# Patient Record
Sex: Male | Born: 1935 | Race: White | Hispanic: No | Marital: Married | Smoking: Former smoker
Health system: Southern US, Community
[De-identification: ages and names within clinical notes are randomized; demographics above are authoritative.]

## PROBLEM LIST (undated history)

## (undated) DIAGNOSIS — E785 Hyperlipidemia, unspecified: Secondary | ICD-10-CM

## (undated) DIAGNOSIS — I10 Essential (primary) hypertension: Secondary | ICD-10-CM

## (undated) DIAGNOSIS — I2510 Atherosclerotic heart disease of native coronary artery without angina pectoris: Secondary | ICD-10-CM

## (undated) DIAGNOSIS — C679 Malignant neoplasm of bladder, unspecified: Secondary | ICD-10-CM

## (undated) DIAGNOSIS — D509 Iron deficiency anemia, unspecified: Secondary | ICD-10-CM

## (undated) DIAGNOSIS — J449 Chronic obstructive pulmonary disease, unspecified: Secondary | ICD-10-CM

## (undated) DIAGNOSIS — D369 Benign neoplasm, unspecified site: Secondary | ICD-10-CM

## (undated) DIAGNOSIS — F32A Depression, unspecified: Secondary | ICD-10-CM

## (undated) DIAGNOSIS — K644 Residual hemorrhoidal skin tags: Secondary | ICD-10-CM

## (undated) DIAGNOSIS — F329 Major depressive disorder, single episode, unspecified: Secondary | ICD-10-CM

## (undated) DIAGNOSIS — C669 Malignant neoplasm of unspecified ureter: Secondary | ICD-10-CM

## (undated) DIAGNOSIS — E119 Type 2 diabetes mellitus without complications: Secondary | ICD-10-CM

## (undated) DIAGNOSIS — K5790 Diverticulosis of intestine, part unspecified, without perforation or abscess without bleeding: Secondary | ICD-10-CM

## (undated) DIAGNOSIS — Z8546 Personal history of malignant neoplasm of prostate: Secondary | ICD-10-CM

## (undated) DIAGNOSIS — H3530 Unspecified macular degeneration: Secondary | ICD-10-CM

## (undated) DIAGNOSIS — G912 (Idiopathic) normal pressure hydrocephalus: Secondary | ICD-10-CM

## (undated) DIAGNOSIS — K648 Other hemorrhoids: Secondary | ICD-10-CM

## (undated) HISTORY — DX: Depression, unspecified: F32.A

## (undated) HISTORY — DX: Malignant neoplasm of unspecified ureter: C66.9

## (undated) HISTORY — DX: (Idiopathic) normal pressure hydrocephalus: G91.2

## (undated) HISTORY — DX: Chronic obstructive pulmonary disease, unspecified: J44.9

## (undated) HISTORY — DX: Benign neoplasm, unspecified site: D36.9

## (undated) HISTORY — PX: TONSILLECTOMY: SUR1361

## (undated) HISTORY — DX: Major depressive disorder, single episode, unspecified: F32.9

## (undated) HISTORY — DX: Diverticulosis of intestine, part unspecified, without perforation or abscess without bleeding: K57.90

## (undated) HISTORY — PX: BLADDER SURGERY: SHX569

## (undated) HISTORY — DX: Atherosclerotic heart disease of native coronary artery without angina pectoris: I25.10

## (undated) HISTORY — DX: Type 2 diabetes mellitus without complications: E11.9

## (undated) HISTORY — DX: Malignant neoplasm of bladder, unspecified: C67.9

## (undated) HISTORY — PX: CATARACT EXTRACTION: SUR2

## (undated) HISTORY — DX: Essential (primary) hypertension: I10

## (undated) HISTORY — DX: Other hemorrhoids: K64.8

## (undated) HISTORY — DX: Hyperlipidemia, unspecified: E78.5

## (undated) HISTORY — DX: Personal history of malignant neoplasm of prostate: Z85.46

## (undated) HISTORY — DX: Residual hemorrhoidal skin tags: K64.4

## (undated) HISTORY — DX: Iron deficiency anemia, unspecified: D50.9

## (undated) HISTORY — PX: APPENDECTOMY: SHX54

## (undated) HISTORY — PX: URETHRA SURGERY: SHX824

## (undated) HISTORY — DX: Unspecified macular degeneration: H35.30

## (undated) HISTORY — PX: NEPHRECTOMY: SHX65

---

## 1998-03-30 ENCOUNTER — Other Ambulatory Visit: Admission: RE | Admit: 1998-03-30 | Discharge: 1998-03-30 | Payer: Self-pay | Admitting: Urology

## 1998-07-20 ENCOUNTER — Other Ambulatory Visit: Admission: RE | Admit: 1998-07-20 | Discharge: 1998-07-20 | Payer: Self-pay | Admitting: Urology

## 1999-12-17 ENCOUNTER — Encounter: Payer: Self-pay | Admitting: *Deleted

## 1999-12-17 ENCOUNTER — Ambulatory Visit (HOSPITAL_COMMUNITY): Admission: RE | Admit: 1999-12-17 | Discharge: 1999-12-17 | Payer: Self-pay | Admitting: *Deleted

## 1999-12-20 ENCOUNTER — Ambulatory Visit (HOSPITAL_COMMUNITY): Admission: RE | Admit: 1999-12-20 | Discharge: 1999-12-20 | Payer: Self-pay | Admitting: Urology

## 2000-01-14 ENCOUNTER — Encounter: Payer: Self-pay | Admitting: Urology

## 2000-01-17 ENCOUNTER — Inpatient Hospital Stay (HOSPITAL_COMMUNITY): Admission: RE | Admit: 2000-01-17 | Discharge: 2000-01-22 | Payer: Self-pay | Admitting: Urology

## 2000-06-02 ENCOUNTER — Encounter: Admission: RE | Admit: 2000-06-02 | Discharge: 2000-08-31 | Payer: Self-pay | Admitting: Internal Medicine

## 2000-07-29 ENCOUNTER — Encounter: Admission: RE | Admit: 2000-07-29 | Discharge: 2000-07-29 | Payer: Self-pay | Admitting: Urology

## 2000-07-29 ENCOUNTER — Encounter: Payer: Self-pay | Admitting: Urology

## 2001-02-23 ENCOUNTER — Encounter: Payer: Self-pay | Admitting: Urology

## 2001-02-23 ENCOUNTER — Encounter: Admission: RE | Admit: 2001-02-23 | Discharge: 2001-02-23 | Payer: Self-pay | Admitting: Urology

## 2001-08-19 ENCOUNTER — Encounter: Payer: Self-pay | Admitting: Urology

## 2001-08-19 ENCOUNTER — Encounter: Admission: RE | Admit: 2001-08-19 | Discharge: 2001-08-19 | Payer: Self-pay | Admitting: Urology

## 2003-08-15 ENCOUNTER — Encounter: Admission: RE | Admit: 2003-08-15 | Discharge: 2003-08-15 | Payer: Self-pay | Admitting: Urology

## 2003-08-18 ENCOUNTER — Ambulatory Visit (HOSPITAL_BASED_OUTPATIENT_CLINIC_OR_DEPARTMENT_OTHER): Admission: RE | Admit: 2003-08-18 | Discharge: 2003-08-18 | Payer: Self-pay | Admitting: Urology

## 2003-08-18 ENCOUNTER — Ambulatory Visit (HOSPITAL_COMMUNITY): Admission: RE | Admit: 2003-08-18 | Discharge: 2003-08-18 | Payer: Self-pay | Admitting: Urology

## 2004-05-03 ENCOUNTER — Encounter (INDEPENDENT_AMBULATORY_CARE_PROVIDER_SITE_OTHER): Payer: Self-pay | Admitting: Specialist

## 2004-05-03 ENCOUNTER — Ambulatory Visit (HOSPITAL_COMMUNITY): Admission: RE | Admit: 2004-05-03 | Discharge: 2004-05-03 | Payer: Self-pay | Admitting: Urology

## 2004-05-03 ENCOUNTER — Ambulatory Visit (HOSPITAL_BASED_OUTPATIENT_CLINIC_OR_DEPARTMENT_OTHER): Admission: RE | Admit: 2004-05-03 | Discharge: 2004-05-03 | Payer: Self-pay | Admitting: Urology

## 2004-09-06 ENCOUNTER — Ambulatory Visit: Payer: Self-pay | Admitting: Endocrinology

## 2005-04-08 ENCOUNTER — Ambulatory Visit: Payer: Self-pay | Admitting: Internal Medicine

## 2005-04-15 ENCOUNTER — Ambulatory Visit: Payer: Self-pay | Admitting: Internal Medicine

## 2005-05-28 ENCOUNTER — Ambulatory Visit: Payer: Self-pay | Admitting: Internal Medicine

## 2005-06-10 ENCOUNTER — Ambulatory Visit: Payer: Self-pay | Admitting: Internal Medicine

## 2005-06-10 ENCOUNTER — Encounter (INDEPENDENT_AMBULATORY_CARE_PROVIDER_SITE_OTHER): Payer: Self-pay | Admitting: Specialist

## 2005-07-09 ENCOUNTER — Ambulatory Visit: Payer: Self-pay | Admitting: Internal Medicine

## 2006-02-06 ENCOUNTER — Ambulatory Visit: Payer: Self-pay | Admitting: Endocrinology

## 2006-02-23 ENCOUNTER — Ambulatory Visit: Payer: Self-pay | Admitting: Internal Medicine

## 2006-06-25 ENCOUNTER — Ambulatory Visit: Payer: Self-pay | Admitting: Internal Medicine

## 2006-07-10 ENCOUNTER — Ambulatory Visit: Payer: Self-pay | Admitting: Internal Medicine

## 2006-07-11 DIAGNOSIS — K5790 Diverticulosis of intestine, part unspecified, without perforation or abscess without bleeding: Secondary | ICD-10-CM

## 2006-07-11 HISTORY — DX: Diverticulosis of intestine, part unspecified, without perforation or abscess without bleeding: K57.90

## 2006-07-21 ENCOUNTER — Ambulatory Visit: Payer: Self-pay | Admitting: Internal Medicine

## 2006-07-21 ENCOUNTER — Encounter (INDEPENDENT_AMBULATORY_CARE_PROVIDER_SITE_OTHER): Payer: Self-pay | Admitting: Specialist

## 2006-07-21 HISTORY — PX: COLONOSCOPY: SHX5424

## 2006-08-21 ENCOUNTER — Ambulatory Visit: Payer: Self-pay | Admitting: Internal Medicine

## 2006-11-30 ENCOUNTER — Ambulatory Visit: Payer: Self-pay | Admitting: Internal Medicine

## 2007-05-10 ENCOUNTER — Encounter: Payer: Self-pay | Admitting: *Deleted

## 2007-05-10 DIAGNOSIS — J449 Chronic obstructive pulmonary disease, unspecified: Secondary | ICD-10-CM | POA: Insufficient documentation

## 2007-05-10 DIAGNOSIS — D509 Iron deficiency anemia, unspecified: Secondary | ICD-10-CM

## 2007-05-10 DIAGNOSIS — I2510 Atherosclerotic heart disease of native coronary artery without angina pectoris: Secondary | ICD-10-CM | POA: Insufficient documentation

## 2007-05-10 DIAGNOSIS — E785 Hyperlipidemia, unspecified: Secondary | ICD-10-CM

## 2007-05-10 DIAGNOSIS — Z87891 Personal history of nicotine dependence: Secondary | ICD-10-CM

## 2007-05-10 DIAGNOSIS — Z8546 Personal history of malignant neoplasm of prostate: Secondary | ICD-10-CM

## 2007-05-10 DIAGNOSIS — J4489 Other specified chronic obstructive pulmonary disease: Secondary | ICD-10-CM | POA: Insufficient documentation

## 2007-05-10 DIAGNOSIS — E119 Type 2 diabetes mellitus without complications: Secondary | ICD-10-CM | POA: Insufficient documentation

## 2007-05-10 DIAGNOSIS — I10 Essential (primary) hypertension: Secondary | ICD-10-CM | POA: Insufficient documentation

## 2007-12-03 ENCOUNTER — Encounter: Payer: Self-pay | Admitting: Internal Medicine

## 2007-12-15 ENCOUNTER — Ambulatory Visit: Payer: Self-pay | Admitting: Internal Medicine

## 2007-12-15 DIAGNOSIS — F329 Major depressive disorder, single episode, unspecified: Secondary | ICD-10-CM | POA: Insufficient documentation

## 2008-06-12 ENCOUNTER — Ambulatory Visit: Payer: Self-pay | Admitting: Internal Medicine

## 2008-06-13 LAB — CONVERTED CEMR LAB
Basophils Absolute: 0 10*3/uL (ref 0.0–0.1)
Bilirubin Urine: NEGATIVE
Bilirubin, Direct: 0.2 mg/dL (ref 0.0–0.3)
Calcium: 8.8 mg/dL (ref 8.4–10.5)
Cholesterol: 262 mg/dL (ref 0–200)
GFR calc Af Amer: 75 mL/min
HCT: 40.7 % (ref 39.0–52.0)
HDL: 33.7 mg/dL — ABNORMAL LOW (ref >39.0)
Hemoglobin: 14.2 g/dL (ref 13.0–17.0)
Ketones, ur: NEGATIVE mg/dL
Leukocytes, UA: NEGATIVE
Lymphocytes Relative: 21.8 % (ref 12.0–46.0)
MCHC: 34.8 g/dL (ref 30.0–36.0)
Monocytes Absolute: 0.6 10*3/uL (ref 0.1–1.0)
Neutro Abs: 4.4 10*3/uL (ref 1.4–7.7)
PSA: 0.61 ng/mL (ref 0.10–4.00)
RDW: 13.4 % (ref 11.5–14.6)
Sodium: 141 meq/L (ref 135–145)
TSH: 2.32 microintl units/mL (ref 0.35–5.50)
Total Bilirubin: 0.7 mg/dL (ref 0.3–1.2)
Total CHOL/HDL Ratio: 7.8
Triglycerides: 291 mg/dL (ref 0–149)
pH: 5.5 (ref 5.0–8.0)

## 2008-06-19 ENCOUNTER — Ambulatory Visit: Payer: Self-pay | Admitting: Internal Medicine

## 2008-12-14 ENCOUNTER — Ambulatory Visit: Payer: Self-pay | Admitting: Internal Medicine

## 2008-12-17 LAB — CONVERTED CEMR LAB
Chloride: 105 meq/L (ref 96–112)
Cholesterol: 256 mg/dL — ABNORMAL HIGH (ref 0–200)
Creatinine, Ser: 1.3 mg/dL (ref 0.4–1.5)
Potassium: 4.4 meq/L (ref 3.5–5.1)
Total CHOL/HDL Ratio: 7
Triglycerides: 240 mg/dL — ABNORMAL HIGH (ref 0.0–149.0)
VLDL: 48 mg/dL — ABNORMAL HIGH (ref 0.0–40.0)

## 2008-12-18 ENCOUNTER — Ambulatory Visit: Payer: Self-pay | Admitting: Internal Medicine

## 2009-06-07 ENCOUNTER — Encounter (INDEPENDENT_AMBULATORY_CARE_PROVIDER_SITE_OTHER): Payer: Self-pay | Admitting: *Deleted

## 2009-06-14 ENCOUNTER — Ambulatory Visit: Payer: Self-pay | Admitting: Internal Medicine

## 2009-06-15 LAB — CONVERTED CEMR LAB
AST: 20 units/L (ref 0–37)
Direct LDL: 159.7 mg/dL
PSA: 0.73 ng/mL (ref 0.10–4.00)
TSH: 2.3 microintl units/mL (ref 0.35–5.50)
Total Bilirubin: 0.8 mg/dL (ref 0.3–1.2)
Total CHOL/HDL Ratio: 7
Triglycerides: 253 mg/dL — ABNORMAL HIGH (ref 0.0–149.0)
VLDL: 50.6 mg/dL — ABNORMAL HIGH (ref 0.0–40.0)

## 2009-06-18 ENCOUNTER — Ambulatory Visit: Payer: Self-pay | Admitting: Internal Medicine

## 2009-06-18 DIAGNOSIS — R5381 Other malaise: Secondary | ICD-10-CM | POA: Insufficient documentation

## 2009-06-18 DIAGNOSIS — R269 Unspecified abnormalities of gait and mobility: Secondary | ICD-10-CM

## 2009-06-18 DIAGNOSIS — R5383 Other fatigue: Secondary | ICD-10-CM

## 2009-12-12 ENCOUNTER — Ambulatory Visit: Payer: Self-pay | Admitting: Internal Medicine

## 2009-12-12 LAB — CONVERTED CEMR LAB
CO2: 29 meq/L (ref 19–32)
Calcium: 9.1 mg/dL (ref 8.4–10.5)
Cholesterol: 293 mg/dL — ABNORMAL HIGH (ref 0–200)
Direct LDL: 165.1 mg/dL
GFR calc non Af Amer: 61.49 mL/min (ref >60)
HDL: 40.1 mg/dL (ref >39.00)
Sodium: 143 meq/L (ref 135–145)
Triglycerides: 304 mg/dL — ABNORMAL HIGH (ref 0.0–149.0)

## 2009-12-24 ENCOUNTER — Ambulatory Visit: Payer: Self-pay | Admitting: Internal Medicine

## 2009-12-24 ENCOUNTER — Telehealth: Payer: Self-pay | Admitting: Internal Medicine

## 2009-12-24 DIAGNOSIS — M79609 Pain in unspecified limb: Secondary | ICD-10-CM

## 2009-12-24 DIAGNOSIS — L02619 Cutaneous abscess of unspecified foot: Secondary | ICD-10-CM

## 2009-12-24 DIAGNOSIS — L03119 Cellulitis of unspecified part of limb: Secondary | ICD-10-CM

## 2009-12-25 ENCOUNTER — Encounter: Payer: Self-pay | Admitting: Internal Medicine

## 2010-01-01 ENCOUNTER — Ambulatory Visit: Payer: Self-pay | Admitting: Internal Medicine

## 2010-01-07 ENCOUNTER — Encounter: Payer: Self-pay | Admitting: Internal Medicine

## 2010-01-11 ENCOUNTER — Ambulatory Visit: Payer: Self-pay | Admitting: Internal Medicine

## 2010-01-14 ENCOUNTER — Encounter: Payer: Self-pay | Admitting: Internal Medicine

## 2010-01-17 ENCOUNTER — Encounter: Payer: Self-pay | Admitting: Internal Medicine

## 2010-01-24 ENCOUNTER — Telehealth: Payer: Self-pay | Admitting: Internal Medicine

## 2010-02-01 ENCOUNTER — Encounter: Payer: Self-pay | Admitting: Internal Medicine

## 2010-02-04 ENCOUNTER — Encounter: Payer: Self-pay | Admitting: Internal Medicine

## 2010-02-08 HISTORY — PX: VENTRICULOPERITONEAL SHUNT: SHX204

## 2010-02-12 ENCOUNTER — Encounter: Payer: Self-pay | Admitting: Internal Medicine

## 2010-02-14 ENCOUNTER — Encounter: Payer: Self-pay | Admitting: Internal Medicine

## 2010-02-16 ENCOUNTER — Inpatient Hospital Stay (HOSPITAL_COMMUNITY): Admission: EM | Admit: 2010-02-16 | Discharge: 2010-02-25 | Payer: Self-pay | Admitting: Emergency Medicine

## 2010-03-06 ENCOUNTER — Inpatient Hospital Stay (HOSPITAL_COMMUNITY): Admission: RE | Admit: 2010-03-06 | Discharge: 2010-03-07 | Payer: Self-pay | Admitting: Neurosurgery

## 2010-03-10 ENCOUNTER — Emergency Department (HOSPITAL_COMMUNITY): Admission: EM | Admit: 2010-03-10 | Discharge: 2010-03-10 | Payer: Self-pay | Admitting: Emergency Medicine

## 2010-03-20 ENCOUNTER — Encounter: Payer: Self-pay | Admitting: Internal Medicine

## 2010-04-12 ENCOUNTER — Ambulatory Visit: Payer: Self-pay | Admitting: Internal Medicine

## 2010-04-12 DIAGNOSIS — G910 Communicating hydrocephalus: Secondary | ICD-10-CM | POA: Insufficient documentation

## 2010-04-18 ENCOUNTER — Ambulatory Visit (HOSPITAL_COMMUNITY): Admission: RE | Admit: 2010-04-18 | Discharge: 2010-04-18 | Payer: Self-pay | Admitting: Neurosurgery

## 2010-04-18 ENCOUNTER — Encounter: Payer: Self-pay | Admitting: Internal Medicine

## 2010-05-02 ENCOUNTER — Ambulatory Visit (HOSPITAL_COMMUNITY): Admission: RE | Admit: 2010-05-02 | Discharge: 2010-05-02 | Payer: Self-pay | Admitting: Neurosurgery

## 2010-05-02 ENCOUNTER — Encounter: Payer: Self-pay | Admitting: Internal Medicine

## 2010-05-09 ENCOUNTER — Telehealth: Payer: Self-pay | Admitting: Internal Medicine

## 2010-06-03 ENCOUNTER — Ambulatory Visit (HOSPITAL_COMMUNITY): Admission: RE | Admit: 2010-06-03 | Discharge: 2010-06-03 | Payer: Self-pay | Admitting: Neurosurgery

## 2010-06-04 ENCOUNTER — Encounter: Payer: Self-pay | Admitting: Internal Medicine

## 2010-07-05 ENCOUNTER — Ambulatory Visit: Payer: Self-pay | Admitting: Internal Medicine

## 2010-07-12 ENCOUNTER — Ambulatory Visit: Payer: Self-pay | Admitting: Internal Medicine

## 2010-07-16 LAB — CONVERTED CEMR LAB
ALT: 15 units/L (ref 0–53)
AST: 18 units/L (ref 0–37)
Albumin: 3.7 g/dL (ref 3.5–5.2)
BUN: 22 mg/dL (ref 6–23)
Basophils Relative: 0.3 % (ref 0.0–3.0)
CO2: 28 meq/L (ref 19–32)
Chloride: 102 meq/L (ref 96–112)
Creatinine, Ser: 1.3 mg/dL (ref 0.4–1.5)
Eosinophils Absolute: 0.1 10*3/uL (ref 0.0–0.7)
Eosinophils Relative: 1.6 % (ref 0.0–5.0)
Glucose, Bld: 145 mg/dL — ABNORMAL HIGH (ref 70–99)
Lymphocytes Relative: 17.7 % (ref 12.0–46.0)
Monocytes Absolute: 0.5 10*3/uL (ref 0.1–1.0)
Neutrophils Relative %: 73.4 % (ref 43.0–77.0)
Platelets: 189 10*3/uL (ref 150.0–400.0)
Potassium: 4 meq/L (ref 3.5–5.1)
RBC: 4.11 M/uL — ABNORMAL LOW (ref 4.22–5.81)
TSH: 1.35 microintl units/mL (ref 0.35–5.50)
Total Protein: 6.3 g/dL (ref 6.0–8.3)
WBC: 6.5 10*3/uL (ref 4.5–10.5)

## 2010-09-02 ENCOUNTER — Ambulatory Visit (HOSPITAL_COMMUNITY)
Admission: RE | Admit: 2010-09-02 | Discharge: 2010-09-02 | Payer: Self-pay | Source: Home / Self Care | Attending: Neurosurgery | Admitting: Neurosurgery

## 2010-09-02 ENCOUNTER — Encounter: Payer: Self-pay | Admitting: Internal Medicine

## 2010-09-10 NOTE — Miscellaneous (Signed)
Summary: Order / [facility]  Order / [facility]   Imported By: Lennie Odor 01/14/2010 16:54:59  _____________________________________________________________________  External Attachment:    Type:   Image     Comment:   External Document

## 2010-09-10 NOTE — Miscellaneous (Signed)
Summary: Order/[facility]  Order/[facility]   Imported By: Sherian Rein 02/14/2010 09:54:04  _____________________________________________________________________  External Attachment:    Type:   Image     Comment:   External Document

## 2010-09-10 NOTE — Letter (Signed)
Summary: [facility]  [facility]   Imported By: Sherian Rein 06/27/2010 10:55:08  _____________________________________________________________________  External Attachment:    Type:   Image     Comment:   External Document

## 2010-09-10 NOTE — Assessment & Plan Note (Signed)
Summary: YEARLY FU/ MEDICARE AND BCBS/NWS #   Vital Signs:  Patient profile:   77 year old male Height:      69 inches Weight:      175 pounds BMI:     25.94 O2 Sat:      95 % on Room air Temp:     98.9 degrees F oral Pulse rate:   84 / minute BP sitting:   170 / 88  (left arm) Cuff size:   regular  Vitals Entered By: Lucious Groves (Dec 24, 2009 10:34 AM)  O2 Flow:  Room air CC: Yearly f/u--Pt has had a fall recently and c/o not being able to breathe./kb Is Patient Diabetic? No Pain Assessment Patient in pain? no      Comments Patient spouse notes that he is not taking Spiriva./kb   CC:  Yearly f/u--Pt has had a fall recently and c/o not being able to breathe./kb.  History of Present Illness: Larey Seat on mother's day - lost balance. He bent over to pick up smthg.  He did not feel anything in 2 h - then L ankle started to hurt. It started to swell  The patient presents for a wellness examination  Diet: Heart healthy   Physical activity - not active.   Depression/mood screen: Negative.  Hearing: Intact  bilateral.  Visual Acuity: Grossly abnormal w/glasses.  ADL's: not capable. Fall risk: High.  Home safety: good otherwise.  End of LifePlanning/Advanced directive - Full code. I agree. Discussed the need for AD.   Current Medications (verified): 1)  Cartia Xt 180 Mg  Cp24 (Diltiazem Hcl Coated Beads) .... Take 1 By Mouth Qd 2)  Zoloft 100 Mg  Tabs (Sertraline Hcl) .... Take 2 By Mouth Qd 3)  Fish Oil 1200 Mg  Caps (Omega-3 Fatty Acids) .... Two Times A Day 4)  Vitamin D 1000 Unit  Tabs (Cholecalciferol) .... Once Daily 5)  Baby Aspirin 81 Mg Chew (Aspirin) .... Once Daily  Allergies (verified): 1)  ! Sulfa  Past History:  Past Medical History: Last updated: 12/15/2007 Anemia-iron deficiency Prostate cancer, hx of COPD Coronary artery disease  Diabetes mellitus, type II Hyperlipidemia Hypertension Macular degeneration B 2008 Depression  Past Surgical  History: Last updated: 05/10/2007 Nephrectomy  Family History: Last updated: 12/15/2007 CAD  Social History: Last updated: 12/15/2007 Retired Married Current Smoker  Review of Systems       The patient complains of anorexia, vision loss, dyspnea on exertion, peripheral edema, muscle weakness, difficulty walking, and depression.  The patient denies fever, weight loss, weight gain, decreased hearing, hoarseness, chest pain, syncope, prolonged cough, headaches, hemoptysis, abdominal pain, melena, hematochezia, severe indigestion/heartburn, hematuria, incontinence, genital sores, suspicious skin lesions, transient blindness, unusual weight change, abnormal bleeding, enlarged lymph nodes, angioedema, and testicular masses.    Physical Exam  General:  NAD, overweight-appearing.  Chronically ill-appearing in his PJ w/wife at his side Head:  Normocephalic and atraumatic without obvious abnormalities. No apparent alopecia or balding. Eyes:  No corneal or conjunctival inflammation noted. EOMI. Perrla. Ears:  B hearing aids Nose:  WNL Mouth:  Not dry Neck:  No mass Chest Wall:  NT Lungs:  R decreased breath sounds and L decreased breath sounds.   Heart:  No gallop RRR Abdomen:  Bowel sounds positive,abdomen soft and non-tender without masses, organomegaly or hernias noted. Rectal:  per Urol Prostate:  per Urol Msk:  L foot is erythem/purple and hot  in a sock like distrib Toes 1-3 are bruised  There  are scabs on toes Extremities:  1+ left pedal edema.   Neurologic:  alert & oriented X3.   Skin:  as above Cervical Nodes:  No lymphadenopathy noted Inguinal Nodes:  No significant adenopathy Psych:  normally interactive and good eye contact.  not suicidal and depressed affect.  Oriented X3, not suicidal, depressed affect, subdued, and judgment fair.     Impression & Recommendations:  Problem # 1:  PHYSICAL EXAMINATION (ICD-V70.0) Assessment New Overall doing well, age appropriate  education and counseling updated and referral for appropriate preventive services done unless declined, immunizations up to date or declined, diet counseling done if overweight, urged to quit smoking if smokes, most recent labs reviewed and current ordered if appropriate, ecg reviewed or declined (interpretation per ECG scanned in the EMR if done); information regarding Medicare Preventation requirements given if appropriate.  The labs were reviewed with the patient.   Problem # 2:  CELLULITIS, FOOT (ICD-682.7) L - possible Assessment: New  His updated medication list for this problem includes:    Augmentin 875-125 Mg Tabs (Amoxicillin-pot clavulanate) .Marland Kitchen... 1 by mouth bid  Problem # 3:  FOOT PAIN (ICD-729.5) L - fibula fx on Xray Assessment: New Ortho tomorrow Orders: T-Ankle Comp Left Min 3 Views (73610TC) T-Foot Left Min 3 Views (73630TC) Home Health Referral (Home Health) Ace Wraps 3-5 in/yard  (Z6109)  Problem # 4:  FATIGUE (ICD-780.79) chronic/ FTT Assessment: Unchanged  Problem # 5:  CELLULITIS, FOOT (ICD-682.7) possible Assessment: New  His updated medication list for this problem includes:    Augmentin 875-125 Mg Tabs (Amoxicillin-pot clavulanate) .Marland Kitchen... 1 by mouth two times a day - empiric  Orders: Prescription Created Electronically [phone])  Problem # 6:  CORONARY ARTERY DISEASE (ICD-414.00) Assessment: Unchanged  His updated medication list for this problem includes:    Cartia Xt 180 Mg Cp24 (Diltiazem hcl coated beads) .Marland Kitchen... Take 1 by mouth qd    Baby Aspirin 81 Mg Chew (Aspirin) ..... Once daily  Orders: Home Health Referral (Home Health)  Complete Medication List: 1)  Cartia Xt 180 Mg Cp24 (Diltiazem hcl coated beads) .... Take 1 by mouth qd 2)  Zoloft 100 Mg Tabs (Sertraline hcl) .... Take 2 by mouth qd 3)  Fish Oil 1200 Mg Caps (Omega-3 fatty acids) .... Two times a day 4)  Vitamin D 1000 Unit Tabs (Cholecalciferol) .... Once daily 5)  Baby Aspirin 81 Mg  Chew (Aspirin) .... Once daily 6)  Augmentin 875-125 Mg Tabs (Amoxicillin-pot clavulanate) .Marland Kitchen.. 1 by mouth bid  Patient Instructions: 1)  Elevate left foot 20 min every 2-3 hrs 2)  Please schedule a follow-up appointment in 1-2 weeks. 3)  Call if you are not better in a reasonable amount of time or if worse. Go to ER if feeling really bad!  Prescriptions: AUGMENTIN 875-125 MG TABS (AMOXICILLIN-POT CLAVULANATE) 1 by mouth bid  #20 x 0   Entered and Authorized by:   Tresa Garter MD   Signed by:   Tresa Garter MD on 12/24/2009   Method used:   Electronically to        [facility] [address] [phone]* (retail)       [address]       [place], [place]  [address]       Ph: [phone] or [phone]       Fax: [phone]   RxID:   [id number]

## 2010-09-10 NOTE — Assessment & Plan Note (Signed)
Summary: 3 MTH FU  STC   Vital Signs:  Patient profile:   77 year old male Height:      69 inches Weight:      167 pounds BMI:     24.75 Temp:     97.5 degrees F oral Pulse rate:   72 / minute Pulse rhythm:   regular Resp:     16 per minute BP sitting:   130 / 64  (left arm) Cuff size:   regular  Vitals Entered By: Lanier Prude, CMA(AAMA) (July 12, 2010 1:55 PM) CC: 3 mo f/u    CC:  3 mo f/u .  History of Present Illness: The patient presents for a follow up of hypertension, depression, hyperlipidemia   Current Medications (verified): 1)  Cartia Xt 180 Mg  Cp24 (Diltiazem Hcl Coated Beads) .... Take 1 By Mouth Qd 2)  Zoloft 100 Mg  Tabs (Sertraline Hcl) .... Take 2 By Mouth Qd 3)  Fish Oil 1200 Mg  Caps (Omega-3 Fatty Acids) .... Two Times A Day 4)  Vitamin D 2000 Unit Tabs (Cholecalciferol) .Marland Kitchen.. 1 By Mouth Two Times A Day 5)  Baby Aspirin 81 Mg Chew (Aspirin) .... Once Daily 6)  A Walker .... As Dirr.  Allergies (verified): 1)  ! Sulfa 2)  Remeron  Past History:  Past Medical History: Last updated: 04/12/2010 Anemia-iron deficiency Prostate cancer, hx of COPD Coronary artery disease Normal pressure hydrocephallus 2011 Diabetes mellitus, type II Hyperlipidemia Hypertension Macular degeneration B 2008 Depression  Past Surgical History: Last updated: 04/12/2010 Nephrectomy Cerebral shunt 7/11  Social History: Last updated: 04/12/2010 Retired Married Former Smoker 7/11  Review of Systems  The patient denies fever, decreased hearing, dyspnea on exertion, abdominal pain, and melena.    Physical Exam  General:  NAD, overweight-appearing.  Not chronically ill-appearing  Ears:  B hearing aids Mouth:  Not dry Neck:  No mass Lungs:  R decreased breath sounds and L decreased breath sounds.   Heart:  No gallop RRR Abdomen:  Bowel sounds positive,abdomen soft and non-tender without masses, organomegaly or hernias noted. Msk:   better  balance Using a cane Neurologic:  alert & oriented ataxic gait.   Skin:  as above Psych:  normally interactive and good eye contact.  not suicidal and depressed affect.  Oriented subdued.     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 Take 2 by mouth once daily OK to start to taper it down  Problem # 4:  FATIGUE (ICD-780.79) Assessment: Unchanged  Problem # 5:  DIABETES MELLITUS, TYPE II (ICD-250.00) Assessment: Unchanged  His updated medication list for this problem includes:    Baby Aspirin 81 Mg Chew (Aspirin) ..... Once daily  Orders: TLB-A1C / Hgb A1C (Glycohemoglobin) (83036-A1C) TLB-BMP (Basic Metabolic Panel-BMET) (80048-METABOL) TLB-Hepatic/Liver Function Pnl (80076-HEPATIC) TLB-CBC Platelet - w/Differential (85025-CBCD) TLB-TSH (Thyroid Stimulating Hormone) (84443-TSH)  Complete Medication List: 1)  Cartia Xt 180 Mg Cp24 (Diltiazem hcl coated beads) .... Take 1 by mouth qd 2)  Zoloft 100 Mg Tabs (Sertraline hcl) .... Take 2 by mouth  qd 3)  Fish Oil 1200 Mg Caps (Omega-3 fatty acids) .... Two times a day 4)  Vitamin D 2000 Unit Tabs (Cholecalciferol) .Marland Kitchen.. 1 by mouth two times a day 5)  Baby Aspirin 81 Mg Chew (Aspirin) .... Once daily 6)  A Walker  .... As dirr.  Patient Instructions: 1)  Zoloft: 150 mg a day x 1 month, then 100 mg daily x 1 month, then 50 mg daily x 1 month 2)  Please schedule a follow-up  appointment in 3 months. 3)  BMP prior to visit, ICD-9: 4)  HbgA1C prior to visit, ICD-9:250.00   Orders Added: 1)  TLB-A1C / Hgb A1C (Glycohemoglobin) [83036-A1C] 2)  TLB-BMP (Basic Metabolic Panel-BMET) [80048-METABOL] 3)  TLB-Hepatic/Liver Function Pnl [80076-HEPATIC] 4)  TLB-CBC Platelet - w/Differential [85025-CBCD] 5)  TLB-TSH (Thyroid Stimulating Hormone) [84443-TSH] 6)  Est. Patient Level IV [57846]   Immunization History:  Pneumovax Immunization History:    Pneumovax:  historical (02/17/2007)   Immunization History:  Pneumovax Immunization History:    Pneumovax:  Historical (02/17/2007)

## 2010-09-10 NOTE — Miscellaneous (Signed)
Summary: Certification & Plan of Care / [facility]  Certification & Plan of Care / [facility]   Imported By: Lennie Odor 01/14/2010 16:56:48  _____________________________________________________________________  External Attachment:    Type:   Image     Comment:   External Document

## 2010-09-10 NOTE — Miscellaneous (Signed)
Summary: Certificate Addendum / [facility] Face / [facility]  Certificate Addendum / [facility] / [facility]   Imported By: Lennie Odor 01/14/2010 17:05:53  _____________________________________________________________________  External Attachment:    Type:   Image     Comment:   External Document

## 2010-09-10 NOTE — Miscellaneous (Signed)
Summary: Order/[facility]  [facility]   Imported By: Sherian Rein 02/19/2010 08:06:54  _____________________________________________________________________  External Attachment:    Type:   Image     Comment:   External Document

## 2010-09-10 NOTE — Letter (Signed)
Summary: [facility]  [facility]   Imported By: Sherian Rein 04/02/2010 09:00:47  _____________________________________________________________________  External Attachment:    Type:   Image     Comment:   External Document

## 2010-09-10 NOTE — Letter (Signed)
Summary: [facility]  [facility]   Imported By: Sherian Rein 02/13/2010 12:14:32  _____________________________________________________________________  External Attachment:    Type:   Image     Comment:   External Document

## 2010-09-10 NOTE — Letter (Signed)
Summary: [facility]  [facility]   Imported By: Sherian Rein 05/21/2010 13:52:45  _____________________________________________________________________  External Attachment:    Type:   Image     Comment:   External Document

## 2010-09-10 NOTE — Miscellaneous (Signed)
Summary: [name]   Imported By: Sherian Rein 01/21/2010 08:04:32  _____________________________________________________________________  External Attachment:    Type:   Image     Comment:   External Document

## 2010-09-10 NOTE — Assessment & Plan Note (Signed)
Summary: discharge from [facility]/#/cd   Vital Signs:  Patient profile:   77 year old male Height:      69 inches Weight:      164 pounds O2 Sat:      91 % on Room air Temp:     97.5 degrees F oral Pulse rate:   85 / minute Pulse rhythm:   regular Resp:     16 per minute BP sitting:   128 / 80  (left arm) Cuff size:   regular  Vitals Entered By: Lanier Prude, CMA(AAMA) (April 12, 2010 9:43 AM)  O2 Flow:  Room air CC: f/u  Is Patient Diabetic? Yes Comments pt is not taking ASA. He is taking Tylenol 500mg  1 as needed instead   CC:  f/u .  History of Present Illness: The patient presents for a follow up of  leg pain, falls.  back pain, anxiety, depression and headaches.   Preventive Screening-Counseling & Management  Alcohol-Tobacco     Smoking Status: quit  Current Medications (verified): 1)  Cartia Xt 180 Mg  Cp24 (Diltiazem Hcl Coated Beads) .... Take 1 By Mouth Qd 2)  Zoloft 100 Mg  Tabs (Sertraline Hcl) .... Take 2 By Mouth Qd 3)  Fish Oil 1200 Mg  Caps (Omega-3 Fatty Acids) .... Two Times A Day 4)  Vitamin D 1000 Unit  Tabs (Cholecalciferol) .... Once Daily 5)  Baby Aspirin 81 Mg Chew (Aspirin) .... Once Daily  Allergies (verified): 1)  ! Sulfa 2)  Remeron  Past History:  Social History: Last updated: 04/12/2010 Retired Married Former Smoker 7/11  Past Medical History: Anemia-iron deficiency Prostate cancer, hx of COPD Coronary artery disease Normal pressure hydrocephallus 2011 Diabetes mellitus, type II Hyperlipidemia Hypertension Macular degeneration B 2008 Depression  Past Surgical History: Nephrectomy Cerebral shunt 7/11  Social History: Retired Married Former Smoker 7/11 Smoking Status:  quit  Review of Systems       The patient complains of difficulty walking.  The patient denies fever, chest pain, hemoptysis, and abdominal pain.         using walker  Physical Exam  General:  NAD, overweight-appearing.   Chronically ill-appearing w/wife at his side Head:  palpable shunt Eyes:  No corneal or conjunctival inflammation noted. EOMI. Perrla. Mouth:  Not dry Neck:  No mass Lungs:  R decreased breath sounds and L decreased breath sounds.   Heart:  No gallop RRR Abdomen:  Bowel sounds positive,abdomen soft and non-tender without masses, organomegaly or hernias noted. Msk:   better balance Using a walker Neurologic:  alert & oriented ataxic gait.   Skin:  as above Psych:  normally interactive and good eye contact.  not suicidal and depressed affect.  Oriented subdued.     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 Once daily  Problem # 5:  HYPERTENSION (ICD-401.9) Assessment: Unchanged  His updated medication list for this problem includes:    Cartia Xt 180 Mg Cp24 (Diltiazem hcl coated beads) .Marland Kitchen... Take 1 by mouth qd  BP today: 128/80 Prior BP: 154/80 (01/01/2010)  Labs Reviewed: K+: 4.4 (12/12/2009) Creat: : 1.2 (12/12/2009)  Chol: 293 (12/12/2009)   HDL: 40.10 (12/12/2009)   LDL: DEL (06/12/2008)   TG: 304.0 (12/12/2009)  Problem # 6:  TOBACCO USE, QUIT (ICD-V15.82) Assessment: Improved quit  Complete Medication List: 1)  Cartia Xt 180 Mg Cp24 (Diltiazem hcl coated beads) .... Take 1 by mouth qd 2)  Zoloft 100 Mg Tabs (Sertraline hcl) .... Take 2 by mouth qd 3)  Fish Oil 1200 Mg Caps (Omega-3 fatty acids) .... Two times a day 4)  Vitamin D 1000 Unit Tabs (Cholecalciferol) .... Once daily 5)  Baby Aspirin 81 Mg Chew (Aspirin) .... Once daily 6)  A Walker  .... As dirr.  Other Orders: Flu  Vaccine 5yrs + (32951) Administration Flu vaccine - MCR (O8416)  Patient Instructions: 1)  Please schedule a follow-up appointment in 3 months. 2)  BMP prior to visit, ICD-9: 3)  Hepatic Panel prior to visit, ICD-9: 4)  TSH prior to visit, ICD-9: 5)  CBC w/ Diff prior to visit, ICD-9: 6)  Urine Microalbumin prior to visit, ICD-9: 995.20 250.00 401.1 Prescriptions: A WALKER as dirr.  #1 x 0   Entered and Authorized by:   Tresa Garter MD   Signed by:   Tresa Garter MD on 04/12/2010   Method used:   Print then Give to Patient   RxID:   [id number]     Orders Added: 1)  Flu Vaccine 39yrs + [73220] 2)  Administration Flu vaccine - MCR [G0008] 3)  Est. Patient Level IV [25427]  Flu Vaccine Consent Questions     Do you have a history of severe allergic reactions to this vaccine? no    Any prior history of allergic reactions to egg and/or gelatin? no    Do you have a sensitivity to the preservative Thimersol? no    Do you have a past history of Guillan-Barre Syndrome? no    Do you currently have an acute febrile illness? no    Have you ever had a severe reaction to latex? no    Vaccine information given and explained to patient? yes    Are you currently pregnant? no    Lot Number:[id number]   Exp Date:02/08/2011   Site Given  Left Deltoid IM Lanier Prude, [facility])  April 12, 2010 5:03 PM

## 2010-09-10 NOTE — Letter (Signed)
Summary: [facility]  [facility]   Imported By: Sherian Rein 02/26/2010 10:24:48  _____________________________________________________________________  External Attachment:    Type:   Image     Comment:   External Document

## 2010-09-10 NOTE — Miscellaneous (Signed)
Summary: Order/[facility]  [facility]   Imported By: Lester Powhatan 01/11/2010 10:37:40  _____________________________________________________________________  External Attachment:    Type:   Image     Comment:   External Document

## 2010-09-10 NOTE — Letter (Signed)
Summary: [facility]  [facility]   Imported By: Sherian Rein 01/16/2010 07:52:00  _____________________________________________________________________  External Attachment:    Type:   Image     Comment:   External Document

## 2010-09-10 NOTE — Progress Notes (Signed)
Summary: Schedule Office visit   Phone Note Outgoing Call Call back at [facility] Phone [phone]   Call placed by: Harlow Mares CMA Duncan Dull),  January 24, 2010 4:13 PM Call placed to: Patient Summary of Call: patient has a broken foot and the wife will call back to schedule an office visit ot discuss colonoscopy Initial call taken by: Harlow Mares CMA Duncan Dull),  January 24, 2010 4:30 PM

## 2010-09-10 NOTE — Miscellaneous (Signed)
Summary: PT Order/[facility]  PT Order/[facility]   Imported By: Sherian Rein 02/06/2010 07:52:06  _____________________________________________________________________  External Attachment:    Type:   Image     Comment:   External Document

## 2010-09-10 NOTE — Assessment & Plan Note (Signed)
Summary: 1-2 WK F/U // # / CD   Vital Signs:  Patient profile:   77 year old male Height:      69 inches Weight:      176 pounds BMI:     26.08 O2 Sat:      93 % on Room air Temp:     97 degrees F oral Pulse rate:   85 / minute BP sitting:   154 / 80  (left arm) Cuff size:   regular  Vitals Entered ByZella Ball Ewing (Jan 01, 2010 10:23 AM)  O2 Flow:  Room air CC: 2 week followup/RE   CC:  2 week followup/RE.  History of Present Illness: The patient presents for a follow up of FTT, fibula fx,  hypertension.   Current Medications (verified): 1)  Cartia Xt 180 Mg  Cp24 (Diltiazem Hcl Coated Beads) .... Take 1 By Mouth Qd 2)  Zoloft 100 Mg  Tabs (Sertraline Hcl) .... Take 2 By Mouth Qd 3)  Fish Oil 1200 Mg  Caps (Omega-3 Fatty Acids) .... Two Times A Day 4)  Vitamin D 1000 Unit  Tabs (Cholecalciferol) .... Once Daily 5)  Baby Aspirin 81 Mg Chew (Aspirin) .... Once Daily 6)  Augmentin 875-125 Mg Tabs (Amoxicillin-Pot Clavulanate) .Marland Kitchen.. 1 By Mouth Bid  Allergies (verified): 1)  ! Sulfa  Physical Exam  General:  NAD, overweight-appearing.  Chronically ill-appearing in his PJ w/wife at his side Nose:  WNL Mouth:  Not dry Neck:  No mass Lungs:  R decreased breath sounds and L decreased breath sounds.   Heart:  No gallop RRR Abdomen:  Bowel sounds positive,abdomen soft and non-tender without masses, organomegaly or hernias noted. Msk:  all better Extremities:  No  pedal edema.   Neurologic:  alert & oriented X3.   Skin:  as above Psych:  normally interactive and good eye contact.  not suicidal and depressed affect.  Oriented X3, not suicidal, depressed affect, subdued, and judgment fair.     Impression & Recommendations:  Problem # 1:  FOOT PAIN (ICD-729.5) due to fx L Assessment Improved L leg is in a boot. Getting PT  Problem # 2:  CELLULITIS, FOOT (ICD-682.7) Assessment: Improved  His updated medication list for this problem includes:    Augmentin 875-125 Mg  Tabs (Amoxicillin-pot clavulanate) .Marland Kitchen... 1 by mouth two times a day ok to d/c  Problem # 3:  DIABETES MELLITUS, TYPE II (ICD-250.00) Assessment: Unchanged  His updated medication list for this problem includes:    Baby Aspirin 81 Mg Chew (Aspirin) ..... Once daily  Problem # 4:  HYPERTENSION (ICD-401.9) Assessment: Unchanged  His updated medication list for this problem includes:    Cartia Xt 180 Mg Cp24 (Diltiazem hcl coated beads) .Marland Kitchen... Take 1 by mouth qd  BP today: 154/80 Prior BP: 170/88 (12/24/2009)  Labs Reviewed: K+: 4.4 (12/12/2009) Creat: : 1.2 (12/12/2009)   Chol: 293 (12/12/2009)   HDL: 40.10 (12/12/2009)   LDL: DEL (06/12/2008)   TG: 304.0 (12/12/2009)  Problem # 5:  FATIGUE (ICD-780.79) multifactorial Assessment: Improved  Problem # 6:  COPD (ICD-496) Assessment: Unchanged  Complete Medication List: 1)  Cartia Xt 180 Mg Cp24 (Diltiazem hcl coated beads) .... Take 1 by mouth qd 2)  Zoloft 100 Mg Tabs (Sertraline hcl) .... Take 2 by mouth qd 3)  Fish Oil 1200 Mg Caps (Omega-3 fatty acids) .... Two times a day 4)  Vitamin D 1000 Unit Tabs (Cholecalciferol) .... Once daily 5)  Baby Aspirin 81  Mg Chew (Aspirin) .... Once daily 6)  Augmentin 875-125 Mg Tabs (Amoxicillin-pot clavulanate) .Marland Kitchen.. 1 by mouth bid  Patient Instructions: 1)  Please schedule a follow-up appointment in 2 months. 2)  Stop Augmentin

## 2010-09-10 NOTE — Progress Notes (Signed)
Summary: FYI  Phone Note Other Incoming   Caller: Alcario Drought @ [facility] Summary of Call: FYI: pt has been DC'd from nursing at [facility].  Any questions call [phone] Initial call taken by: Lanier Prude, [facility]),  May 09, 2010 10:03 AM  Follow-up for Phone Call        noted Thx Follow-up by: Tresa Garter MD,  May 09, 2010 11:36 AM

## 2010-09-10 NOTE — Progress Notes (Signed)
Summary: FRACTURE  Phone Note From Other Clinic   Summary of Call: Radiology call, Pt has oblique distal fibular fracture.  Initial call taken by: Lamar Sprinkles, CMA,  Dec 24, 2009 2:37 PM  Follow-up for Phone Call        I called and left a VM He needs to see Ortho tomorrow Follow-up by: Tresa Garter MD,  Dec 24, 2009 6:04 PM  Additional Follow-up for Phone Call Additional follow up Details #1::        Pt has ortho apt per [facility], pt is aware Additional Follow-up by: Lamar Sprinkles, CMA,  Dec 25, 2009 9:15 AM  New Problems: FOOT PAIN (ICD-729.5)   New Problems: FOOT PAIN (ICD-729.5)

## 2010-09-10 NOTE — Letter (Signed)
Summary: [facility]  [facility]   Imported By: Sherian Rein 05/07/2010 15:10:10  _____________________________________________________________________  External Attachment:    Type:   Image     Comment:   External Document

## 2010-09-23 ENCOUNTER — Encounter: Payer: Self-pay | Admitting: Internal Medicine

## 2010-09-23 ENCOUNTER — Ambulatory Visit (INDEPENDENT_AMBULATORY_CARE_PROVIDER_SITE_OTHER): Payer: Medicare Other | Admitting: Internal Medicine

## 2010-09-23 DIAGNOSIS — E119 Type 2 diabetes mellitus without complications: Secondary | ICD-10-CM

## 2010-09-23 DIAGNOSIS — J449 Chronic obstructive pulmonary disease, unspecified: Secondary | ICD-10-CM

## 2010-09-23 DIAGNOSIS — F329 Major depressive disorder, single episode, unspecified: Secondary | ICD-10-CM

## 2010-09-23 DIAGNOSIS — G910 Communicating hydrocephalus: Secondary | ICD-10-CM

## 2010-10-02 NOTE — Letter (Signed)
Summary: [facility]  [facility]   Imported By: Sherian Rein 09/25/2010 09:31:52  _____________________________________________________________________  External Attachment:    Type:   Image     Comment:   External Document

## 2010-10-08 NOTE — Assessment & Plan Note (Signed)
Summary: Difficulty Breathing   Vital Signs:  Patient profile:   77 year old male Weight:      181 pounds Temp:     97.9 degrees F Pulse rate:   74 / minute BP sitting:   152 / 76  (left arm) Cuff size:   regular  Vitals Entered By: Lamar Sprinkles, CMA (September 23, 2010 2:31 PM) CC: Trouble breathing/wants to discuss restarting spiriva/SD Comments Zoloft - decreasing med - will be off in 17 days/SD   CC:  Trouble breathing/wants to discuss restarting spiriva/SD.  History of Present Illness: C/o SOB  - Spiriva was working but gave him constipation The patient presents for a follow up of back pain, anxiety, depression and gait disorder. No more headaches.   Current Medications (verified): 1)  Cartia Xt 180 Mg  Cp24 (Diltiazem Hcl Coated Beads) .... Take 1 By Mouth Qd 2)  Zoloft 100 Mg  Tabs (Sertraline Hcl) .... Decreasing Med 3)  Fish Oil 1200 Mg  Caps (Omega-3 Fatty Acids) .... Two Times A Day 4)  Vitamin D 2000 Unit Tabs (Cholecalciferol) .Marland Kitchen.. 1 By Mouth Two Times A Day 5)  Baby Aspirin 81 Mg Chew (Aspirin) .... Once Daily  Allergies (verified): 1)  ! Sulfa 2)  Remeron  Past History:  Past Medical History: Last updated: 04/12/2010 Anemia-iron deficiency Prostate cancer, hx of COPD Coronary artery disease Normal pressure hydrocephallus 2011 Diabetes mellitus, type II Hyperlipidemia Hypertension Macular degeneration B 2008 Depression  Past Surgical History: Last updated: 04/12/2010 Nephrectomy Cerebral shunt 7/11  Family History: Last updated: 12/15/2007 CAD  Social History: Last updated: 04/12/2010 Retired Married Former Smoker 7/11  Review of Systems       The patient complains of vision loss, decreased hearing, dyspnea on exertion, muscle weakness, difficulty walking, and depression.  The patient denies anorexia, weight loss, and weight gain.    Physical Exam  General:  NAD, overweight-appearing.  Not chronically ill-appearing  Head:  palpable  shunt Eyes:  No corneal or conjunctival inflammation noted. EOMI. Perrla. Ears:  B hearing aids Nose:  WNL Mouth:  Not dry Neck:  No mass Lungs:  R decreased breath sounds and L decreased breath sounds.   Heart:  No gallop RRR Abdomen:  Bowel sounds positive,abdomen soft and non-tender without masses, organomegaly or hernias noted. Prostate:  NE Msk:  He has a better balance now Using a cane Neurologic:  alert & oriented Less ataxic gait.   Skin:  as above Psych:  suicidal "in theory", no plans depressed affect and subdued.     Impression & Recommendations:  Problem # 1:  DIABETES MELLITUS, TYPE II (ICD-250.00) Assessment Unchanged  His updated medication list for this problem includes:    Baby Aspirin 81 Mg Chew (Aspirin) ..... Once daily  Problem # 2:  COPD (ICD-496) Assessment: Deteriorated  His updated medication list for this problem includes:    Spiriva Handihaler 18 Mcg Caps (Tiotropium bromide monohydrate) .Marland Kitchen... 1 caps via device qd    Symbicort 80-4.5 Mcg/act Aero (Budesonide-formoterol fumarate) .Marland Kitchen... 1 inh bid  Problem # 3:  ABNORMALITY OF GAIT (ICD-781.2) Assessment: Unchanged Better after the shunt was placed  Problem # 4:  CORONARY ARTERY DISEASE (ICD-414.00) Assessment: Unchanged  His updated medication list for this problem includes:    Cartia Xt 180 Mg Cp24 (Diltiazem hcl coated beads) .Marland Kitchen... Take 1 by mouth qd    Baby Aspirin 81 Mg Chew (Aspirin) ..... Once daily  Problem # 5:  PROSTATE CANCER, HX OF (ICD-V10.46)  Assessment: Comment Only  Problem # 6:  DEPRESSION (ICD-311) Assessment: Deteriorated He is thinking of moving to leave at the assisted living place. We discussed his potential options for living arrangements with him and his wife. He is feeling better about perspectives now. His updated medication list for this problem includes:    Zoloft 100 Mg Tabs (Sertraline hcl) .Marland Kitchen... Decreasing med    Wellbutrin Sr 100 Mg Xr12h-tab (Bupropion hcl)  .Marland Kitchen... 1 by mouth qam The office visit took longer than 45 min with patient councelling for more than 50% of the 45 min   Complete Medication List: 1)  Cartia Xt 180 Mg Cp24 (Diltiazem hcl coated beads) .... Take 1 by mouth qd 2)  Zoloft 100 Mg Tabs (Sertraline hcl) .... Decreasing med 3)  Fish Oil 1200 Mg Caps (Omega-3 fatty acids) .... Two times a day 4)  Vitamin D 2000 Unit Tabs (Cholecalciferol) .Marland Kitchen.. 1 by mouth two times a day 5)  Baby Aspirin 81 Mg Chew (Aspirin) .... Once daily 6)  Colace 100 Mg Caps (Docusate sodium) .Marland Kitchen.. 1 by mouth two times a day for constipation 7)  Spiriva Handihaler 18 Mcg Caps (Tiotropium bromide monohydrate) .Marland Kitchen.. 1 caps via device qd 8)  Symbicort 80-4.5 Mcg/act Aero (Budesonide-formoterol fumarate) .Marland Kitchen.. 1 inh bid 9)  Wellbutrin Sr 100 Mg Xr12h-tab (Bupropion hcl) .Marland Kitchen.. 1 by mouth qam  Patient Instructions: 1)  Please schedule a follow-up appointment in 1-2 months. Prescriptions: WELLBUTRIN SR 100 MG XR12H-TAB (BUPROPION HCL) 1 by mouth qam  #30 x 6   Entered and Authorized by:   Tresa Garter MD   Signed by:   Tresa Garter MD on 09/23/2010   Method used:   Print then Give to Patient   RxID:   [id number] SYMBICORT 80-4.5 MCG/ACT AERO (BUDESONIDE-FORMOTEROL FUMARATE) 1 inh bid  #1 x 12   Entered and Authorized by:   Tresa Garter MD   Signed by:   Tresa Garter MD on 09/23/2010   Method used:   Print then Give to Patient   RxID:   [id number] SPIRIVA HANDIHALER 18 MCG CAPS (TIOTROPIUM BROMIDE MONOHYDRATE) 1 caps via device qd  #30 x 12   Entered and Authorized by:   Tresa Garter MD   Signed by:   Tresa Garter MD on 09/23/2010   Method used:   Print then Give to Patient   RxID:   [id number] COLACE 100 MG CAPS (DOCUSATE SODIUM) 1 by mouth two times a day for constipation  #100 x 6   Entered and Authorized by:   Tresa Garter MD   Signed by:   Tresa Garter MD on 09/23/2010   Method used:    Print then Give to Patient   RxID:   [id number]    Orders Added: 1)  Est. Patient Level V [38756]

## 2010-10-11 ENCOUNTER — Other Ambulatory Visit: Payer: Self-pay

## 2010-10-14 DIAGNOSIS — F329 Major depressive disorder, single episode, unspecified: Secondary | ICD-10-CM

## 2010-10-18 ENCOUNTER — Ambulatory Visit: Payer: Self-pay | Admitting: Internal Medicine

## 2010-10-21 ENCOUNTER — Telehealth: Payer: Self-pay | Admitting: Internal Medicine

## 2010-10-26 LAB — BASIC METABOLIC PANEL
BUN: 14 mg/dL (ref 6–23)
CO2: 27 mEq/L (ref 19–32)
Calcium: 8.7 mg/dL (ref 8.4–10.5)
Calcium: 8.7 mg/dL (ref 8.4–10.5)
Chloride: 95 mEq/L — ABNORMAL LOW (ref 96–112)
Chloride: 99 mEq/L (ref 96–112)
Creatinine, Ser: 1.04 mg/dL (ref 0.4–1.5)
GFR calc Af Amer: >60 mL/min (ref >60)
GFR calc Af Amer: >60 mL/min (ref >60)
GFR calc non Af Amer: >60 mL/min (ref >60)
GFR calc non Af Amer: 54 mL/min — ABNORMAL LOW (ref >60)
Glucose, Bld: 85 mg/dL (ref 70–99)
Potassium: 3.7 mEq/L (ref 3.5–5.1)
Potassium: 4.2 mEq/L (ref 3.5–5.1)
Sodium: 130 mEq/L — ABNORMAL LOW (ref 135–145)
Sodium: 136 mEq/L (ref 135–145)
Sodium: 134 mEq/L — ABNORMAL LOW (ref 135–145)

## 2010-10-26 LAB — COMPREHENSIVE METABOLIC PANEL
Albumin: 3.2 g/dL — ABNORMAL LOW (ref 3.5–5.2)
Alkaline Phosphatase: 89 U/L (ref 39–117)
BUN: 13 mg/dL (ref 6–23)
CO2: 24 mEq/L (ref 19–32)
Chloride: 103 mEq/L (ref 96–112)
Creatinine, Ser: 0.97 mg/dL (ref 0.4–1.5)
GFR calc non Af Amer: >60 mL/min (ref >60)
Glucose, Bld: 99 mg/dL (ref 70–99)
Potassium: 3.7 mEq/L (ref 3.5–5.1)
Total Bilirubin: 0.7 mg/dL (ref 0.3–1.2)

## 2010-10-26 LAB — URINALYSIS, ROUTINE W REFLEX MICROSCOPIC
Bilirubin Urine: NEGATIVE
Glucose, UA: NEGATIVE mg/dL
Ketones, ur: 15 mg/dL — AB
Protein, ur: NEGATIVE mg/dL
pH: 6.5 (ref 5.0–8.0)

## 2010-10-26 LAB — CBC
HCT: 35.1 % — ABNORMAL LOW (ref 39.0–52.0)
HCT: 41.5 % (ref 39.0–52.0)
Hemoglobin: 12.1 g/dL — ABNORMAL LOW (ref 13.0–17.0)
Hemoglobin: 13.8 g/dL (ref 13.0–17.0)
MCH: 30.1 pg (ref 26.0–34.0)
MCH: 30.9 pg (ref 26.0–34.0)
MCV: 90 fL (ref 78.0–100.0)
MCV: 90.5 fL (ref 78.0–100.0)
Platelets: 222 10*3/uL (ref 150–400)
RBC: 3.9 MIL/uL — ABNORMAL LOW (ref 4.22–5.81)
RBC: 4.59 MIL/uL (ref 4.22–5.81)
WBC: 10.1 10*3/uL (ref 4.0–10.5)
WBC: 11.2 10*3/uL — ABNORMAL HIGH (ref 4.0–10.5)

## 2010-10-26 LAB — PROTIME-INR: Prothrombin Time: 13.5 seconds (ref 11.6–15.2)

## 2010-10-26 LAB — DIFFERENTIAL
Basophils Absolute: 0 10*3/uL (ref 0.0–0.1)
Basophils Relative: 0 % (ref 0–1)
Eosinophils Relative: 1 % (ref 0–5)
Monocytes Absolute: 0.7 10*3/uL (ref 0.1–1.0)
Neutro Abs: 8.1 10*3/uL — ABNORMAL HIGH (ref 1.7–7.7)

## 2010-10-26 LAB — APTT: aPTT: 32 seconds (ref 24–37)

## 2010-10-27 LAB — DIFFERENTIAL
Basophils Relative: 0 % (ref 0–1)
Eosinophils Relative: 1 % (ref 0–5)
Lymphs Abs: 1 10*3/uL (ref 0.7–4.0)
Monocytes Absolute: 0.8 10*3/uL (ref 0.1–1.0)
Neutrophils Relative %: 81 % — ABNORMAL HIGH (ref 43–77)

## 2010-10-27 LAB — BASIC METABOLIC PANEL
CO2: 25 mEq/L (ref 19–32)
Calcium: 8.9 mg/dL (ref 8.4–10.5)
Chloride: 105 mEq/L (ref 96–112)
GFR calc Af Amer: >60 mL/min (ref >60)
GFR calc non Af Amer: >60 mL/min (ref >60)
Glucose, Bld: 121 mg/dL — ABNORMAL HIGH (ref 70–99)
Potassium: 4 mEq/L (ref 3.5–5.1)
Sodium: 134 mEq/L — ABNORMAL LOW (ref 135–145)
Sodium: 136 mEq/L (ref 135–145)

## 2010-10-27 LAB — LIPID PANEL
LDL Cholesterol: 129 mg/dL — ABNORMAL HIGH (ref 0–99)
Triglycerides: 254 mg/dL — ABNORMAL HIGH (ref <150)
VLDL: 51 mg/dL — ABNORMAL HIGH (ref 0–40)

## 2010-10-27 LAB — RPR: RPR Ser Ql: NONREACTIVE

## 2010-10-27 LAB — CBC
HCT: 37.6 % — ABNORMAL LOW (ref 39.0–52.0)
Hemoglobin: 11.9 g/dL — ABNORMAL LOW (ref 13.0–17.0)
Hemoglobin: 12.9 g/dL — ABNORMAL LOW (ref 13.0–17.0)
Hemoglobin: 13.1 g/dL (ref 13.0–17.0)
MCH: 30.9 pg (ref 26.0–34.0)
MCH: 31.1 pg (ref 26.0–34.0)
MCHC: 34.7 g/dL (ref 30.0–36.0)
MCHC: 34.9 g/dL (ref 30.0–36.0)
MCV: 89.2 fL (ref 78.0–100.0)
RBC: 4.22 MIL/uL (ref 4.22–5.81)

## 2010-10-27 LAB — COMPREHENSIVE METABOLIC PANEL
BUN: 14 mg/dL (ref 6–23)
CO2: 24 mEq/L (ref 19–32)
Calcium: 8.4 mg/dL (ref 8.4–10.5)
Creatinine, Ser: 0.99 mg/dL (ref 0.4–1.5)
GFR calc Af Amer: >60 mL/min (ref >60)
GFR calc non Af Amer: >60 mL/min (ref >60)
Glucose, Bld: 101 mg/dL — ABNORMAL HIGH (ref 70–99)

## 2010-10-27 LAB — CSF CELL COUNT WITH DIFFERENTIAL
Tube #: 3
WBC, CSF: 1 /mm3 (ref 0–5)

## 2010-10-27 LAB — URINALYSIS, ROUTINE W REFLEX MICROSCOPIC
Bilirubin Urine: NEGATIVE
Hgb urine dipstick: NEGATIVE
Specific Gravity, Urine: 1.017 (ref 1.005–1.030)
pH: 6 (ref 5.0–8.0)

## 2010-10-27 LAB — TSH: TSH: 2.948 u[IU]/mL (ref 0.350–4.500)

## 2010-10-27 LAB — VDRL, CSF: VDRL Quant, CSF: NONREACTIVE

## 2010-10-27 LAB — PROTIME-INR
INR: 1.02 (ref 0.00–1.49)
Prothrombin Time: 13.3 seconds (ref 11.6–15.2)

## 2010-10-27 LAB — HEMOGLOBIN A1C: Mean Plasma Glucose: 114 mg/dL (ref <117)

## 2010-10-29 NOTE — Progress Notes (Signed)
Summary: mediication clarification  Phone Note Call from Patient Call back at Home Phone [phone]   Caller: Spouse Summary of Call: Patient wife called stating that at last appt he was instructed to take 1/2 tab Zoloft daily. He is down to the last pill and wants to know if pt is to continue taking a 1/2 tab zoloft along w/ Wellbutrin on just D/C zoloft all together. Please advise thanks.Alvy Beal Archie CMA  October 21, 2010 11:26 AM    Follow-up for Phone Call        ok to d/c Zoloft now and cont Wellbutrin Follow-up by: Tresa Garter MD,  October 21, 2010 6:06 PM  Additional Follow-up for Phone Call Additional follow up Details #1::        Pt's wife informed  Additional Follow-up by: Lamar Sprinkles, CMA,  October 22, 2010 10:36 AM

## 2010-11-01 ENCOUNTER — Emergency Department (HOSPITAL_COMMUNITY)
Admission: EM | Admit: 2010-11-01 | Discharge: 2010-11-04 | Disposition: A | Discharge status: Other Acute Inpatient Hospital | Payer: Medicare Other | Attending: Emergency Medicine | Admitting: Emergency Medicine

## 2010-11-01 ENCOUNTER — Telehealth: Payer: Self-pay | Admitting: *Deleted

## 2010-11-01 DIAGNOSIS — R45851 Suicidal ideations: Secondary | ICD-10-CM | POA: Insufficient documentation

## 2010-11-01 DIAGNOSIS — R0609 Other forms of dyspnea: Secondary | ICD-10-CM | POA: Insufficient documentation

## 2010-11-01 DIAGNOSIS — R0989 Other specified symptoms and signs involving the circulatory and respiratory systems: Secondary | ICD-10-CM | POA: Insufficient documentation

## 2010-11-01 DIAGNOSIS — I2510 Atherosclerotic heart disease of native coronary artery without angina pectoris: Secondary | ICD-10-CM | POA: Insufficient documentation

## 2010-11-01 DIAGNOSIS — J4489 Other specified chronic obstructive pulmonary disease: Secondary | ICD-10-CM | POA: Insufficient documentation

## 2010-11-01 DIAGNOSIS — J449 Chronic obstructive pulmonary disease, unspecified: Secondary | ICD-10-CM | POA: Insufficient documentation

## 2010-11-01 DIAGNOSIS — Z8546 Personal history of malignant neoplasm of prostate: Secondary | ICD-10-CM | POA: Insufficient documentation

## 2010-11-01 DIAGNOSIS — F3289 Other specified depressive episodes: Secondary | ICD-10-CM | POA: Insufficient documentation

## 2010-11-01 DIAGNOSIS — Z79899 Other long term (current) drug therapy: Secondary | ICD-10-CM | POA: Insufficient documentation

## 2010-11-01 DIAGNOSIS — I10 Essential (primary) hypertension: Secondary | ICD-10-CM | POA: Insufficient documentation

## 2010-11-01 DIAGNOSIS — F329 Major depressive disorder, single episode, unspecified: Secondary | ICD-10-CM | POA: Insufficient documentation

## 2010-11-01 DIAGNOSIS — E785 Hyperlipidemia, unspecified: Secondary | ICD-10-CM | POA: Insufficient documentation

## 2010-11-01 LAB — DIFFERENTIAL
Basophils Relative: 0 % (ref 0–1)
Eosinophils Absolute: 0.1 10*3/uL (ref 0.0–0.7)
Lymphs Abs: 1.9 10*3/uL (ref 0.7–4.0)
Monocytes Absolute: 0.6 10*3/uL (ref 0.1–1.0)
Monocytes Relative: 9 % (ref 3–12)

## 2010-11-01 LAB — COMPREHENSIVE METABOLIC PANEL
ALT: 16 U/L (ref 0–53)
AST: 20 U/L (ref 0–37)
Albumin: 3.7 g/dL (ref 3.5–5.2)
CO2: 22 mEq/L (ref 19–32)
Calcium: 8.9 mg/dL (ref 8.4–10.5)
Creatinine, Ser: 1.31 mg/dL (ref 0.4–1.5)
GFR calc Af Amer: >60 mL/min (ref >60)
GFR calc non Af Amer: 52 mL/min — ABNORMAL LOW (ref >60)
Sodium: 138 mEq/L (ref 135–145)

## 2010-11-01 LAB — ETHANOL

## 2010-11-01 LAB — RAPID URINE DRUG SCREEN, HOSP PERFORMED
Amphetamines: NOT DETECTED
Barbiturates: NOT DETECTED
Benzodiazepines: NOT DETECTED

## 2010-11-01 LAB — CBC
Hemoglobin: 13.5 g/dL (ref 13.0–17.0)
MCH: 31.2 pg (ref 26.0–34.0)
MCHC: 34.8 g/dL (ref 30.0–36.0)
MCV: 89.6 fL (ref 78.0–100.0)
Platelets: 195 10*3/uL (ref 150–400)

## 2010-11-01 NOTE — Telephone Encounter (Signed)
I agree with going to emergency room. Thank you

## 2010-11-01 NOTE — Telephone Encounter (Signed)
Pt's wife is req a referral for pt - clinical psychologist.

## 2010-11-01 NOTE — Telephone Encounter (Signed)
Wife called - She was out of the house with her daugther and when she returned pt advised them he wanted to kill himself. He told her not to let him out of her site. I advised her to take him to the ER now. She agreed and will f/u with MD after ER.

## 2010-11-02 ENCOUNTER — Encounter (HOSPITAL_COMMUNITY): Payer: Self-pay

## 2010-11-02 ENCOUNTER — Emergency Department (HOSPITAL_COMMUNITY): Payer: Medicare Other

## 2010-11-04 NOTE — Consult Note (Signed)
[name], HENDRICKSEN NO.:  [id number]  MEDICAL RECORD NO.:  [id number]           PATIENT TYPE:  E  LOCATION:  [facility]                         FACILITY:  [facility]  PHYSICIAN:  Anselm Jungling, MD  DATE OF BIRTH:  [birth year]  DATE OF CONSULTATION:  11/03/2010 DATE OF DISCHARGE:                                CONSULTATION   IDENTIFYING DATA AND REASON FOR REFERRAL:  The patient is an 77 year old married Caucasian male, who presented to the emergency department because of suicidal ideation.  He was brought by family, specifically spouse.  He had held a knife, gesturing that he might kill himself.  He indicated that he was depressed over chronic illness, a variety of which he would never see any recovery from.  The following information is obtained from the patient, who appears to be a reasonably reliable historian, and the [facility] medical record.  Today, I met with [name] and we talked for about 40 minutes.  He indicated to me that he has indeed had suicidal thoughts.  He tells me that he has even shared this quite openly with his wife and daughter, but "they will let me."  He describes various contributors to this state of mind.  He indicates that he is very discouraged about gradual loss of both hearing and vision due to advancing macular degeneration.  He also indicates that he has other medical problems.  He feels that cognitively he has lost ground as well in recent years.  Furthermore, he indicates to me that he has been experiencing increasing distress over his relationship with a woman with whom he has been carrying on a long-term affair for many years.  He has been increasingly preoccupied with aspects of her romantic history prior to knowing him.  This has led to conflict and consternation in their relationship.  He states that was the principle precipitant for his becoming acutely despondent on the day that he was brought in for admission.  He  tells me that he has spoken to psychiatrist in the past, and has been treated in the past with psychotherapy, and at times medications. Currently, he is taking a regimen of Wellbutrin.  He has had various SSRI antidepressants in the past.  He tells me that he has made suicide attempts in the past, and he estimates "5 or 6 times."  He states that the first time was when he was age 55, after he learned that his parents were divorcing.  He laid down on railroad tracks.  He states that he has made other attempts, even some recently, but indicates that he is too much of a "coward" to carry the amount.  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  However, he feels like his career has been "a fraught", that he never really realized his potential, and got by on "charm and good speaking ability."  He indicates that he has a good relationship with his wife and daughter. He mentions that he actually has two daughters, and one of them  lost a young child to kidney cancer in the past couple of months.  This appears to be another significant stressor.  Chart information indicates that the patient uses alcohol, but when I asked him about this, he states that he has not used alcohol, except for a sip here and there, for several years.  He does admit that he used alcohol very heavily in the past.  He indicates that he is still having intermittent suicidal thoughts.  He recognizes that he is being held in the emergency room because of concern about this, and that he has been referred to the [facility] for further treatment.  He tells me that what he believes he needs is psychotherapy with a therapist that I can give him more than "simplistic answers.".  PAST MEDICAL HISTORY:  Please see medical notes.  MENTAL STATUS AND OBSERVATIONS:  The patient is a  well-nourished, normally-developed elderly man, who was napping when I came to see him, but his lunch arrived simultaneously.  He woke easily and indicated that he was interested in eating lunch while he spoke to me.  He sat on the edge of his bed in a while we talked for about 40 minutes.  He was very pleasant, friendly, and I felt quite open with me about his general circumstances and situation.  He did not hide the fact from me that he has been having suicidal thoughts and seems to feel that ending his life would not be a reasonable option at this point given his overall circumstances.  He seems to accept the fact that we will be trying to find him treatment and the treatment will be geared towards helping him to find a better coping skills and a better understanding of his underlying depression and dynamics.  He did not ask to be discharged home.  He indicated that he understood that we were waiting for possibility of transfer to the [facility] and seemed to accept this.  He also accepted my suggestion of various psychiatrists in the [place] area that he could see for treatment following his stabilization in an inpatient psychiatric setting.  Although his mood appears to be mildly depressed, it is not profoundly so.  Actually, he has a fairly good sense of humor, although it is somewhat wry and dark in nature.  At the end of our conversation, he indicated that he had enjoyed speaking with me.  All-in-all, our interaction was very pleasant and he was a very gracious gentleman.  IMPRESSION:  AXIS I:  Major depressive disorder, recurrent and history of alcohol abuse. AXIS II:  Deferred. AXIS III:  Please see medical notes. AXIS IV:  Stressors severe. AXIS V:  GAF 40.  RECOMMENDATIONS:  At this time, it appears imperative that he be admitted to an inpatient psychiatric facility for further treatment of his underlying depression.  Given the history  that he presents today, it appears that he may have had varying degrees of depression on and off during his life.  After all, he recounts a history of 5 or 6 suicide gestures or attempts, stemming back to age 9.  It seems like he has carried with him a profound sense of inadequacy over many years, in spite of successful career, by all objective accounts.  At this time, he finds himself dismayed by increasing illness and disability, the loss of a grandchild recently, and increasing conflict and dismay over a long-term extramarital affair.  All of these circumstances together would appear to place  him at significant risk for at some point completing suicide. Fortunately, he appears to be quite open to receiving help at this time.  I discussed these impressions with the attending physician assistant here in the emergency department.  I indicated that it appears that Mr. Casten will agree to go to the [facility], but that we should not set aside the involuntary commitment that is apparently already in force.  He should not be allowed to leave AMA.  We should continue the supervision sitter for now.  No specific medication recommendations are made at this time.  Psychiatry consultation service will continue to follow the patient until his transfer to [facility].     Anselm Jungling, MD     SPB/MEDQ  D:  11/03/2010  T:  11/03/2010  Job:  [id number]  Electronically Signed by Geralyn Flash MD on 11/04/2010 11:37:31 AM

## 2010-12-27 NOTE — Assessment & Plan Note (Signed)
[facility]                         GASTROENTEROLOGY OFFICE NOTE   ZEBULUN, DEMAN                      MRN:          [id number]  DATE:08/21/2006                            DOB:          January 26, 1925    CHIEF COMPLAINT:  To discussed possible genetic screening.   Mr. Johnsey has a history of multiple colon polyps.  Many of these have  been adenomatous.  In October, 2006, I removed four polyps.  In October,  2006, I removed seven polyps.  In December, 2007, I removed three  polyps, the largest of which was 10 mm, although these were all  hyperplastic and in the left colon.  The issue of possible attenuated  FAP or MYH mutation comes to mind.  We discussed this.  His 17 year old  daughter has not had a colonoscopy yet.  The one greater than 50 has,  and she does not have polyps.  There is no family history of colon  cancer.  The patient himself does have a history of bladder, kidney, and  prostate cancer, all successfully treated.   He is having some trouble with urge incontinence, particularly after a  heavy meal, particularly in the evening, with supper.  If he takes an  Imodium, he will not move his bowels for several days, which does not  cause too much trouble.   MEDICATIONS:  Listed and reviewed in the chart.   PAST MEDICAL HISTORY:  Reviewed and unchanged from what is noted in the  chart.   PHYSICAL EXAMINATION:  VITAL SIGNS:  Height 5 feet 10.  Weight 191  pounds.  Blood pressure 120/70, pulse 80.   ASSESSMENT:  Multiple colon polyps.  There is some possibility of  attenuated FAP or MYH gene.  It is not really clear.  We discussed the  pro's and con's of genetic testing.  It probably would not change things  for him, as I had him in a surveillance program that seems to be  working.  It might have some bearing for his children.  I have given him  literature.  He will let me know if he wants to get testing done.  His  insurance may not  cover that, although if it is drawn through [facility],  they will check that before sending it through and charging the patient  and let him make that decision.   A trial of Imodium.  On a more regular basis, he can try half a pill or  half of liquid dose to see if that prevents incontinence but does not  cause constipation.  If that does not work, something like dicyclomine  might be in order on an intermittent basis.     Iva Boop, MD,FACG  Electronically Signed    CEG/MedQ  DD: 08/21/2006  DT: 08/21/2006  Job #: [id number]

## 2010-12-27 NOTE — Op Note (Signed)
[name], [name]               ACCOUNT NO.:  [id number]   MEDICAL RECORD NO.:  [id number]          PATIENT TYPE:  AMB   LOCATION:  [facility]                         FACILITY:  [facility]   PHYSICIAN:  [name], M.D.  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   PROCEDURE:  Cystoscopy, transurethral incision of bladder neck contracture,  and transurethral incision of the prostate.   SURGEON:  [name], M.D.   ANESTHESIA:  General.   DRAINS:  A 24 French Foley catheter.   SPECIMENS:  Prostate chips to pathology.   BLOOD LOSS:  10 cc.   COMPLICATIONS:  None.   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  In January, 2005, a low grade  superficial transitional cell carcinoma was identified within the bladder.  I also resected some prostate tissue at that time in order to reduce his  outlet resistance, since he was having obstructive symptoms.  He returned to  my office on March 19, 2004 for surveillance cystoscopy and was found to  have a bladder neck contracture, which was scarred down and displaced  anteriorly somewhat.  The prostatic urethra was noted to have some bilobar  hypertrophy as well.  I was able to dilate the bladder neck to 18 French at  that time with the [employer], but he has had further worsening of his  obstructive symptoms.  He also had some irritative voiding symptoms from his  radiation.  He is brought to the OR today to open up the bladder neck, which  will allow me to use  anticholinergics to control his irritative voiding  symptoms.   DESCRIPTION OF PROCEDURE:  After informed consent, the patient was brought  to the major OR and placed on the table.  Administered general anesthesia  and then moved to the dorsal lithotomy position.  His genitalia was  sterilely prepped and draped.  Initially, the 79 French resectoscope sheath  with the obturator was introduced.  I was able to get it down to the level  of the membranous urethra, but this area was fixed and seemed somewhat  strictured, so I removed the ureteroscope and used the [employer] sounds to  dilate the region to a 30 [place], which it did so easily.  Upon reinserted  the ureteroscope, though, I noted that because there was scarring from the  radiation in the area of the apex of the prostate and membranous urethral  region, it was somewhat difficult to get the resectoscope through this  region.  Using steady, gently pressure, I was able to pass the scope easily  through this region into the prostatic urethra.  There, I  noted at the 12  o'clock position, a bladder neck contracture with the opening into the  bladder at the 12 o'clock location.   Using the resectoscope loop, I resected at the 6 o'clock position and then  was able to pass the resectoscope on into the bladder and identify the right  orifice.  The left orifice was surgically absent.  No tumors, stones, or  inflammatory lesions were identified within the bladder.  I therefore  resected further at the 6 o'clock position and also at the 9 and 3 o'clock  positions, widely opening up the bladder neck.  I then did a transurethral  incision of his prostate at the 6 o'clock position from the level of the  bladder neck out to near the level of the verumontanum, but I stayed well  proximal of the verumontanum at all times.  Bleeding points were then  cauterized, and the few prostate chips that were present were removed from  the bladder and sent to  pathology.  I then inserted a 24 French Foley  catheter for tamponade purposes, and will leave that indwelling in the  recovery room, and it will be removed in the recovery room until prior to  this discharge.  He will be given a prescription for Vicodin ES and three  days of Cipro antibiotic using Cipro 250 mg.  He will return to my office in  two weeks for a recheck.      MCO/MEDQ  D:  05/03/2004  T:  05/03/2004  Job:  [id number]

## 2010-12-30 ENCOUNTER — Telehealth: Payer: Self-pay | Admitting: *Deleted

## 2010-12-30 NOTE — Telephone Encounter (Signed)
Wife called - Pt fell Sat am b/c he became off balance in his kitchen. Only complaint is left sided rib discomfort. No swelling, broken skin, SOB or pain w/inspiration. She wants pt to come in for CXR. I advised pt should have eval from MD. Wife will call pt's ortho to inquire about OV today. If unable to see ortho she will call back and schedule for OV today or tomorrow.

## 2010-12-30 NOTE — Telephone Encounter (Signed)
Agree. Noted. Thx

## 2011-03-27 ENCOUNTER — Other Ambulatory Visit: Payer: Self-pay | Admitting: Internal Medicine

## 2011-05-28 ENCOUNTER — Other Ambulatory Visit: Payer: Self-pay | Admitting: Geriatric Medicine

## 2011-05-28 DIAGNOSIS — G912 (Idiopathic) normal pressure hydrocephalus: Secondary | ICD-10-CM

## 2011-05-29 ENCOUNTER — Ambulatory Visit
Admission: RE | Admit: 2011-05-29 | Discharge: 2011-05-29 | Disposition: A | Discharge status: Home / Self Care | Payer: Medicare Other | Source: Ambulatory Visit | Attending: Geriatric Medicine | Admitting: Geriatric Medicine

## 2011-05-29 DIAGNOSIS — G912 (Idiopathic) normal pressure hydrocephalus: Secondary | ICD-10-CM

## 2011-05-29 MED ORDER — IOHEXOL 300 MG/ML  SOLN
75.0000 mL | Freq: Once | INTRAMUSCULAR | Status: AC | PRN
  Administered 2011-05-29: 75 mL via INTRAVENOUS

## 2011-09-02 ENCOUNTER — Other Ambulatory Visit (HOSPITAL_COMMUNITY): Payer: Self-pay | Admitting: Geriatric Medicine

## 2011-09-02 DIAGNOSIS — J449 Chronic obstructive pulmonary disease, unspecified: Secondary | ICD-10-CM

## 2011-09-05 ENCOUNTER — Encounter (HOSPITAL_COMMUNITY): Payer: Medicare Other

## 2011-09-19 ENCOUNTER — Ambulatory Visit (HOSPITAL_COMMUNITY)
Admission: RE | Admit: 2011-09-19 | Discharge: 2011-09-19 | Disposition: A | Discharge status: Home / Self Care | Payer: Medicare Other | Source: Ambulatory Visit | Attending: Geriatric Medicine | Admitting: Geriatric Medicine

## 2011-09-19 DIAGNOSIS — J4489 Other specified chronic obstructive pulmonary disease: Secondary | ICD-10-CM | POA: Insufficient documentation

## 2011-09-19 DIAGNOSIS — J449 Chronic obstructive pulmonary disease, unspecified: Secondary | ICD-10-CM | POA: Insufficient documentation

## 2011-09-19 MED ORDER — ALBUTEROL SULFATE (5 MG/ML) 0.5% IN NEBU
2.5000 mg | INHALATION_SOLUTION | Freq: Once | RESPIRATORY_TRACT | Status: AC
  Administered 2011-09-19: 2.5 mg via RESPIRATORY_TRACT

## 2011-09-23 ENCOUNTER — Encounter: Payer: Self-pay | Admitting: Internal Medicine

## 2011-10-10 ENCOUNTER — Encounter (INDEPENDENT_AMBULATORY_CARE_PROVIDER_SITE_OTHER): Payer: Medicare Other | Admitting: Ophthalmology

## 2011-10-10 DIAGNOSIS — H43819 Vitreous degeneration, unspecified eye: Secondary | ICD-10-CM

## 2011-10-10 DIAGNOSIS — H3530 Unspecified macular degeneration: Secondary | ICD-10-CM

## 2011-10-22 ENCOUNTER — Ambulatory Visit (INDEPENDENT_AMBULATORY_CARE_PROVIDER_SITE_OTHER): Payer: Medicare Other | Admitting: Internal Medicine

## 2011-10-22 ENCOUNTER — Encounter: Payer: Self-pay | Admitting: Internal Medicine

## 2011-10-22 VITALS — BP 130/62 | HR 80 | Ht 69.0 in | Wt 159.8 lb

## 2011-10-22 DIAGNOSIS — Z860100 Personal history of colon polyps, unspecified: Secondary | ICD-10-CM

## 2011-10-22 DIAGNOSIS — K589 Irritable bowel syndrome without diarrhea: Secondary | ICD-10-CM

## 2011-10-22 NOTE — Progress Notes (Signed)
Patient ID: [name], male   DOB: 10/04/1924, 78 y.o.   MRN: [id number]  This very pleasant 78 year old white man is here with his wife. I have followed him over the years with IBS problems as well as adenomatous colon polyps. His last colonoscopy was in 2007, he had 3 adenomas removed. Index colonoscopy was in 2003 in which he had 3 adenomas, in 2006 he had 7 polyps removed which were adenomas.  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 I had sent out a letter to consider a repeat routine colonoscopy in 2010. He has not really been able to followup until now. His primary care physician is now Dr. Pete Glatter.  The patient's wife reports that he has had numerous tests recently and everything is been okay and nothing can be found wrong. The patient also has a history of urethral cancer, prostate cancer and bladder cancer all of which are thought to be cured at this time.  He ambulates with a walker.  He has chronic problems with alternating bowel habits were left to strain to stool at times, he will also get what he calls this course which are loose stools. He has some urge incontinence problems. He and his wife support that this is a lifelong issue basically. There is no progressive change in stool caliber and he does not report any rectal bleeding or blood in the stools. To the best of my knowledge he is not anemic though I don't have recent labs.  Medications, allergies, past medical and surgical history are listed and reviewed in the chart as well are family history and social history.   Assessment:  1. IBS (irritable bowel syndrome)   2. Personal history of colonic polyps    Plan:  1) increase fiber in diet using a fiber cereal which has helped according to his wife or use something like a Metamucil supplement. She reports that he agrees if he take that on a regular basis his stools are much better and more  regular without difficulty though not perfect.  2) he has a history of colon polyps. He spent some time discussing a colonoscopy, and the risks benefits and indications for this. He understands he could develop and die from colon cancer.  He really is not interested or inclined to pursue a colonoscopy but would do so if he needed to. We talked about this further, and it is his decision not to pursue routine repeat colonoscopy but to investigate significant problems like anemia, bleeding, bowel habit changes that would indicate a need. He knows these could be late signs of colon cancer and that if detected at that time it might not be.   15 to 20 minutes time spent discussing these issues with the patient and his wife today.  Carbon copy to Dr. Pete Glatter

## 2011-10-22 NOTE — Patient Instructions (Signed)
Please eat fiber cereal or add one tablespoon of Metamucil every day to your diet to regulate your bowels.

## 2011-10-23 ENCOUNTER — Encounter: Payer: Self-pay | Admitting: Internal Medicine

## 2012-01-12 ENCOUNTER — Other Ambulatory Visit: Payer: Self-pay | Admitting: Dermatology

## 2012-06-24 IMAGING — CT CT HEAD W/O CM
2 series · 15 of 30 positions shown, 19 images · non-contrast
Comparison: 04/18/2010

CLINICAL DATA: Hydrocephalus.  Extra-axial fluid collection.

CT HEAD WITHOUT CONTRAST
TECHNIQUE: Contiguous axial images were obtained from the base of
the skull through the vertex without contrast.

[Series 3: head w/o · axial · non-contrast · 0.41mm/px · z∈[+88,+223]mm · 13 of 33 slices shown, 17 images]
[im 3/33  brain]
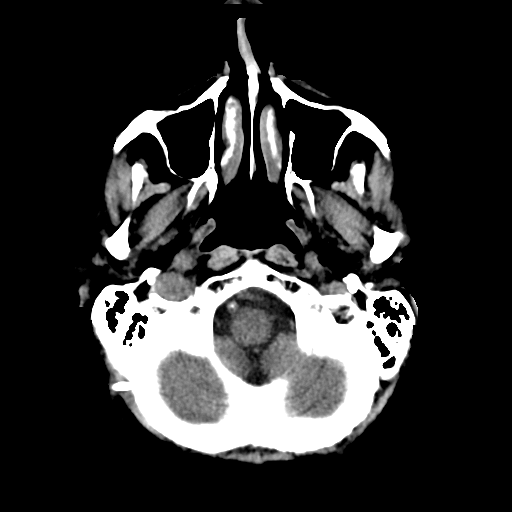
[im 3/33  bone]
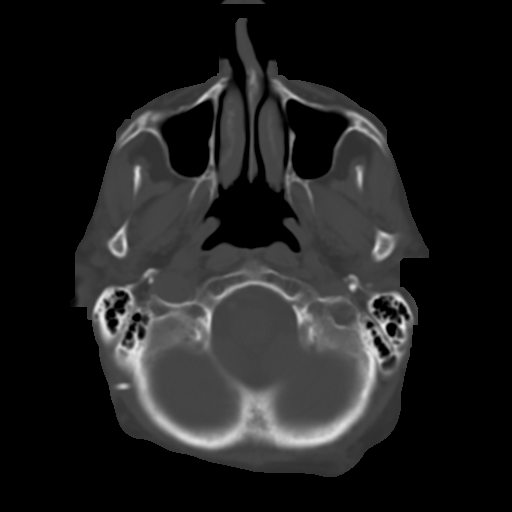
[im 5/33  brain]
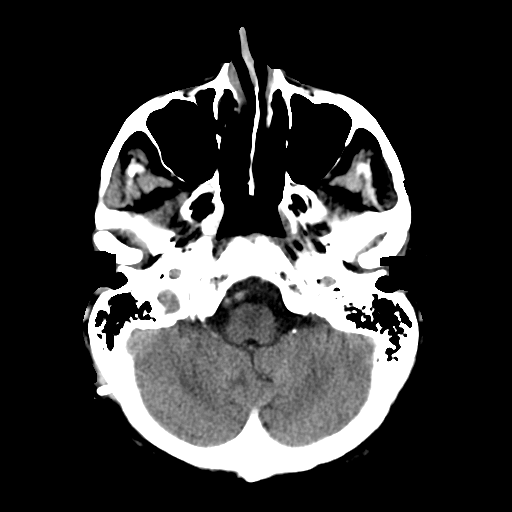
[im 7/33  brain]
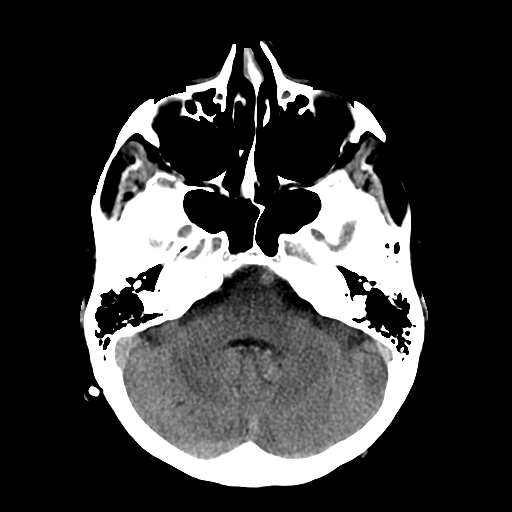
[im 10/33  brain]
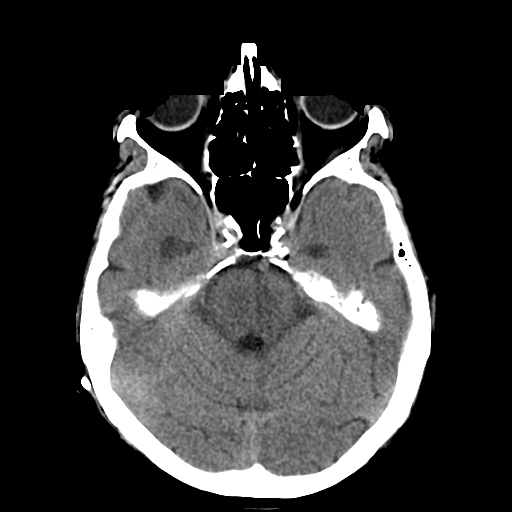
[im 12/33  brain]
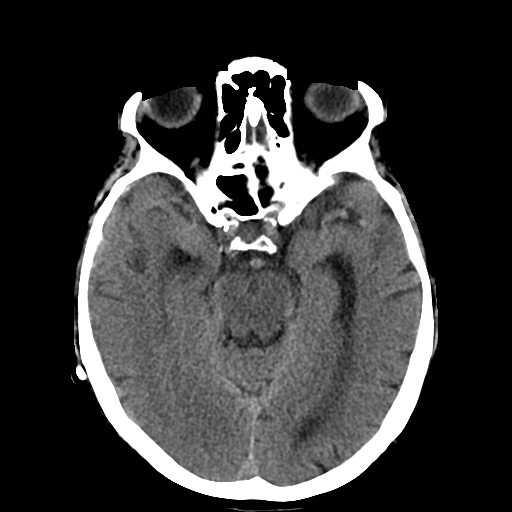
[im 12/33  bone]
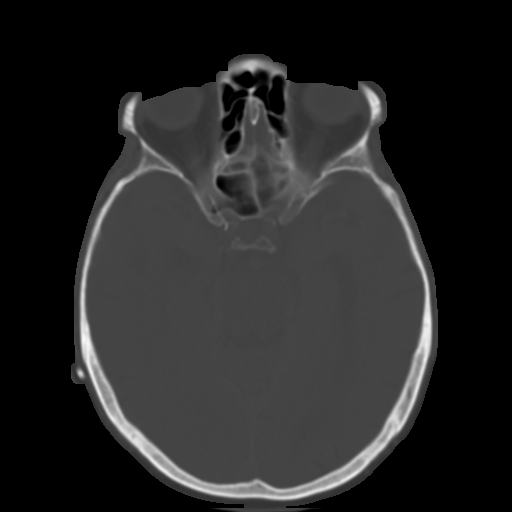
[im 14/33  brain]
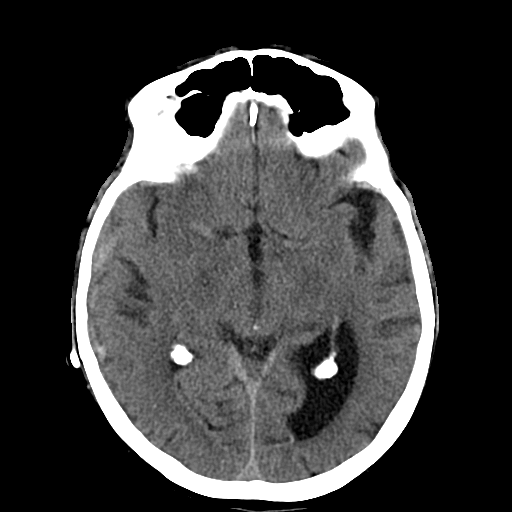
[im 17/33  brain]
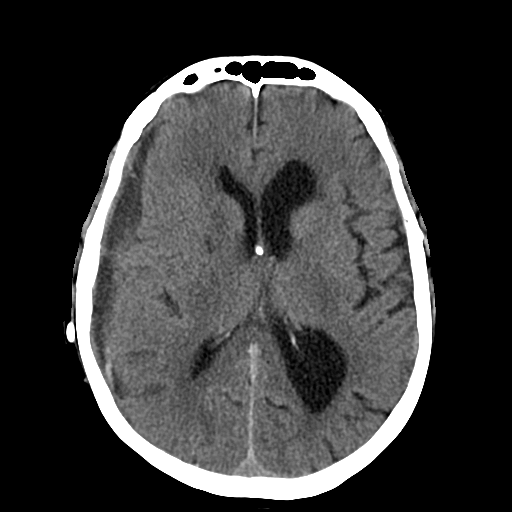
[im 19/33  brain]
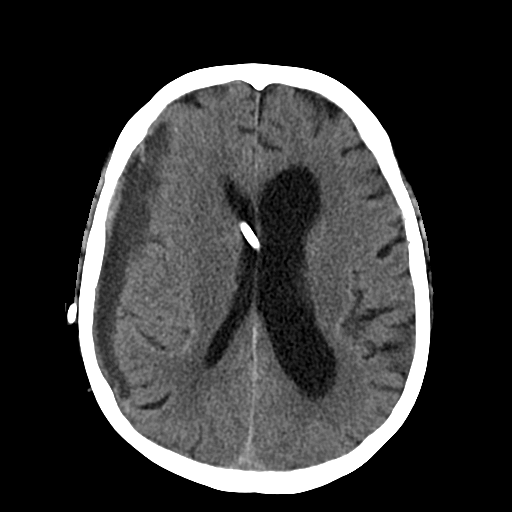
[im 21/33  brain]
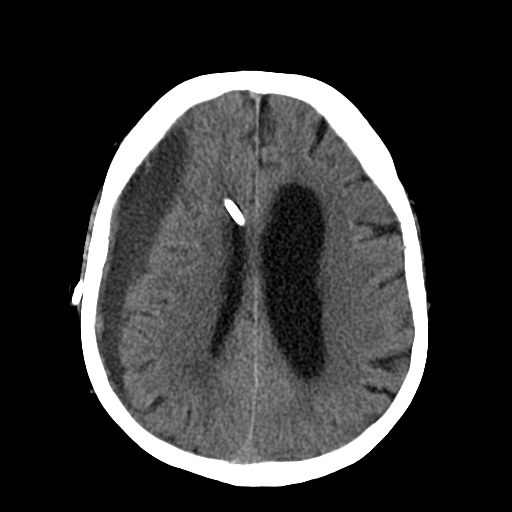
[im 21/33  bone]
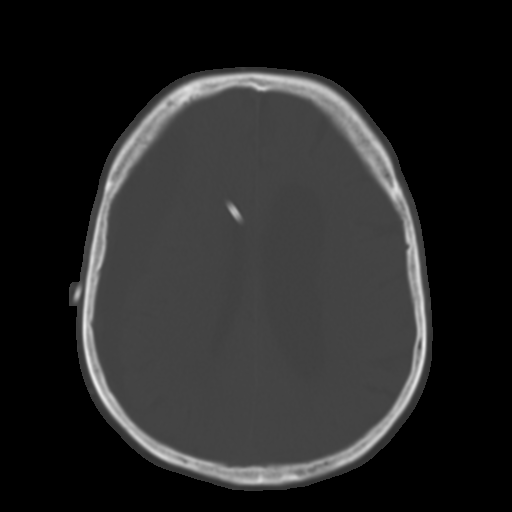
[im 23/33  brain]
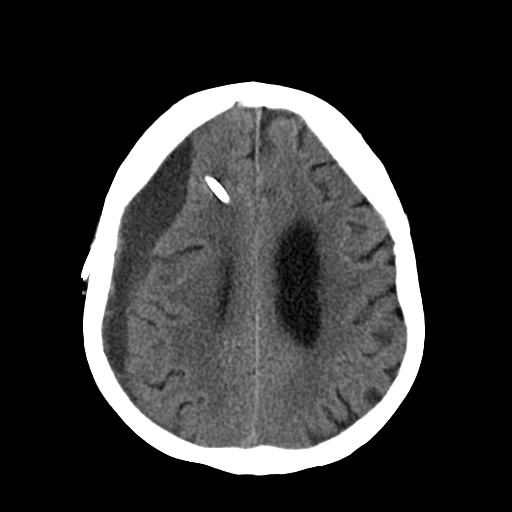
[im 26/33  brain]
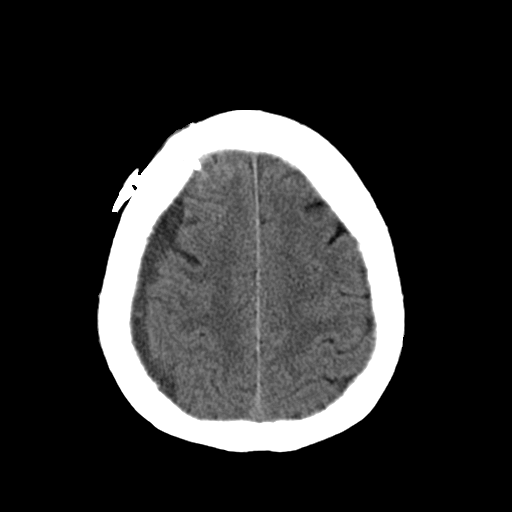
[im 28/33  brain]
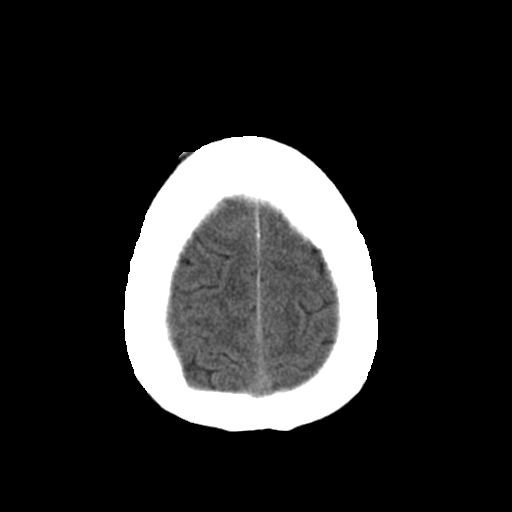
[im 30/33  brain]
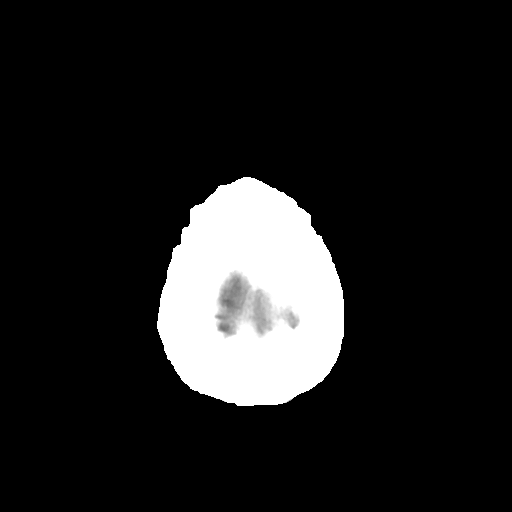
[im 30/33  bone]
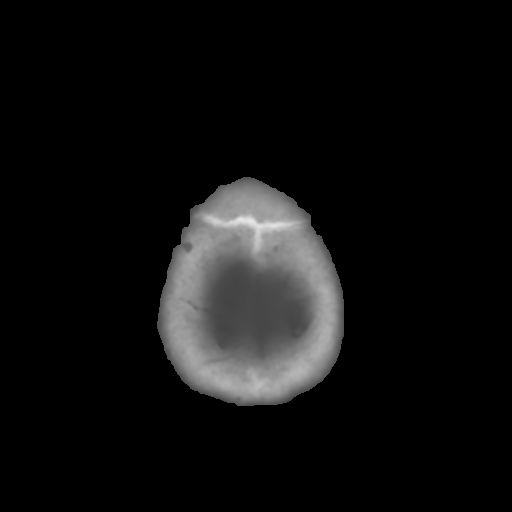

[Series 4: head w/o bone · axial · non-contrast · 0.41mm/px · z∈[+88,+108]mm · 2 of 33 slices shown]
[im 3/33  bone]
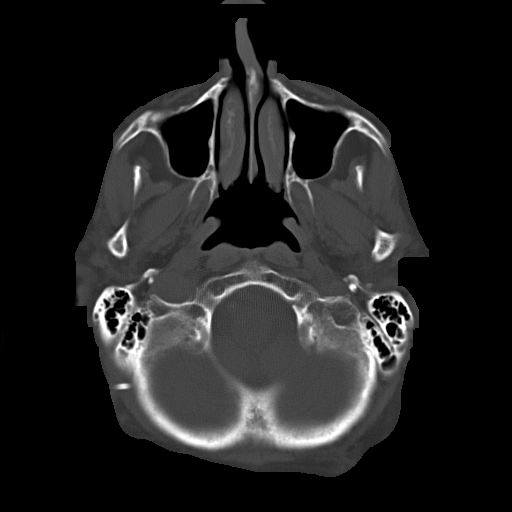
[im 7/33  bone]
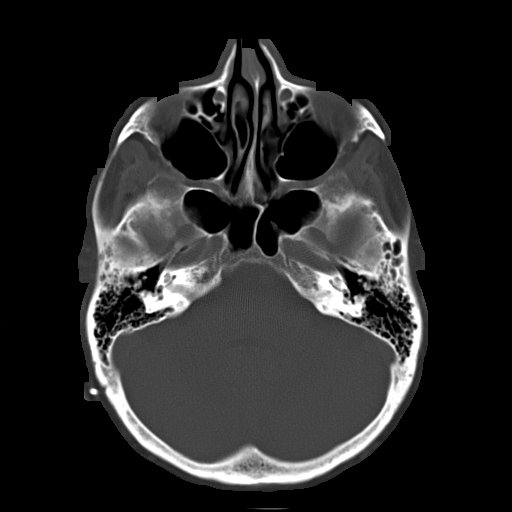

[15 of 30 positions shown; findings below may reference images not displayed]

FINDINGS: A right-sided extra-axial fluid collection is again
noted.  There is continued evolution with decreased density.
Hyperdense septations are evident.  A right frontal ventriculostomy
drain is in place, directed towards the foramen of Combo.  There is
some reexpansion of the right lateral ventricle.  Left ventricular
enlargement has not changed significantly.  Asymmetric left-sided
ventricular enlargement persists.  Midline shift is significantly
decreased, now measuring 3 mm at the foramen of Combo.  No new
areas of hemorrhage are evident.  No acute infarct or mass lesion
is present.  A remote lacunar infarct of the right internal capsule
is stable.

The paranasal sinuses and mastoid air cells are clear.
Atherosclerotic calcifications are noted in the cavernous carotid
arteries.  Postsurgical changes of the right apex are stable.
IMPRESSION: 1.  Stable overall size of the neck extra-axial fluid collection on
the right, representing a chronic subdural hematoma.
2.  Interval decrease in midline shift and mass mass effect, now
measuring 3.3 mm.
3.  Partial re-expansion of the right lateral ventricle and no
significant change in the left lateral ventricle.

## 2012-10-11 ENCOUNTER — Ambulatory Visit (INDEPENDENT_AMBULATORY_CARE_PROVIDER_SITE_OTHER): Payer: Medicare Other | Admitting: Ophthalmology

## 2012-10-11 DIAGNOSIS — H43819 Vitreous degeneration, unspecified eye: Secondary | ICD-10-CM

## 2012-10-11 DIAGNOSIS — H35379 Puckering of macula, unspecified eye: Secondary | ICD-10-CM

## 2012-10-11 DIAGNOSIS — I10 Essential (primary) hypertension: Secondary | ICD-10-CM

## 2012-10-28 ENCOUNTER — Emergency Department (INDEPENDENT_AMBULATORY_CARE_PROVIDER_SITE_OTHER): Payer: Medicare Other

## 2012-10-28 ENCOUNTER — Emergency Department (HOSPITAL_COMMUNITY)
Admission: EM | Admit: 2012-10-28 | Discharge: 2012-10-28 | Disposition: A | Discharge status: Home / Self Care | Payer: Medicare Other | Source: Home / Self Care

## 2012-10-28 ENCOUNTER — Encounter (HOSPITAL_COMMUNITY): Payer: Self-pay | Admitting: *Deleted

## 2012-10-28 DIAGNOSIS — J441 Chronic obstructive pulmonary disease with (acute) exacerbation: Secondary | ICD-10-CM

## 2012-10-28 DIAGNOSIS — R05 Cough: Secondary | ICD-10-CM

## 2012-10-28 DIAGNOSIS — R0982 Postnasal drip: Secondary | ICD-10-CM

## 2012-10-28 MED ORDER — METHYLPREDNISOLONE 4 MG PO KIT: PACK | ORAL | Status: DC

## 2012-10-28 MED ORDER — GUAIFENESIN-CODEINE 100-10 MG/5ML PO SYRP: 5.0000 mL | ORAL_SOLUTION | ORAL | Status: DC | PRN

## 2012-10-28 MED ORDER — GUAIFENESIN-CODEINE 100-10 MG/5ML PO SYRP
5.0000 mL | ORAL_SOLUTION | ORAL | Status: DC | PRN
Start: 2012-10-28 — End: 2015-05-11

## 2012-10-28 MED ORDER — DOXYCYCLINE HYCLATE 100 MG PO CAPS: 100.0000 mg | ORAL_CAPSULE | Freq: Two times a day (BID) | ORAL | Status: DC

## 2012-10-28 MED ORDER — LORATADINE 10 MG PO TABS: ORAL_TABLET | ORAL | Status: DC

## 2012-10-28 NOTE — ED Provider Notes (Signed)
Medical screening examination/treatment/procedure(s) were performed by non-physician practitioner and as supervising physician I was immediately available for consultation/collaboration.   [facility]; MD  Sharin Grave, MD 10/28/12 [phone]

## 2012-10-28 NOTE — ED Notes (Signed)
Pt reports productive cough for 10 days with white phelgm "feels like I'm drowning in it and I'm exhausted"

## 2012-10-28 NOTE — ED Provider Notes (Signed)
History     CSN: [id number]  Arrival date & time 10/28/12  1220   First MD Initiated Contact with Patient 10/28/12 1224      Chief Complaint  Patient presents with  . URI  . Cough    (Consider location/radiation/quality/duration/timing/severity/associated sxs/prior treatment) HPI Comments: 79 year old male presents with cough for 10 days. Is associated with shortness of breath at rest and D.O.E. The cough is productive with copious amount of clear sputum. With fall and carried cough there is a pronounced wet rattling fluid in the upper anterior chest/trachea He is also complaining of persistent PND. The DOE started since his cough began. He denies fever, chills, GI or GU symptoms. He has a PMH of smoking but quit approximately a year or more ago. COPD, CAD, DM, HTN, anemia and IBS.   Past Medical History  Diagnosis Date  . Iron deficiency anemia   . History of prostate cancer   . COPD (chronic obstructive pulmonary disease)   . CAD (coronary artery disease)   . DM (diabetes mellitus)   . HLD (hyperlipidemia)   . HTN (hypertension)   . Macular degeneration   . Depression   . Normal pressure hydrocephalus   . Diverticulosis 07/2006  . Hemorrhoids, internal   . Hemorrhoids, external   . Adenomatous polyps 2003, 2006, 2007  . Ureteral cancer   . Bladder cancer     Past Surgical History  Procedure Laterality Date  . Nephrectomy    . Ventriculoperitoneal shunt  02/2010  . Colonoscopy  07/21/2006  . Appendectomy    . Tonsillectomy    . Cataract extraction      with lens implants  . Bladder surgery      cancer  . Urethra surgery      stricture    Family History  Problem Relation Age of Onset  . Coronary artery disease Father     History  Substance Use Topics  . Smoking status: Former Smoker    Types: Cigarettes  . Smokeless tobacco: Never Used  . Alcohol Use: No      Review of Systems  Constitutional: Positive for activity change and fatigue. Negative for  fever and diaphoresis.  HENT: Positive for postnasal drip. Negative for ear pain, sore throat, facial swelling, rhinorrhea, trouble swallowing, neck pain and neck stiffness.   Eyes: Negative for pain, discharge and redness.  Respiratory: Positive for cough. Negative for chest tightness and shortness of breath.   Cardiovascular: Positive for chest pain.       States he has rare, intermittent sharp pain in the left anterior chest for several months.  Gastrointestinal: Negative.   Musculoskeletal: Negative.   Neurological: Negative.     Allergies  Iron; Mirtazapine; and Sulfonamide derivatives  Home Medications   Current Outpatient Rx  Name  Route  Sig  Dispense  Refill  . aspirin 81 MG tablet   Oral   Take 81 mg by mouth daily.         . Cholecalciferol (VITAMIN D) 2000 UNITS CAPS   Oral   Take 1 capsule by mouth 2 (two) times daily.         Marland Kitchen diltiazem (CARDIZEM CD) 180 MG 24 hr capsule      TAKE 1 CAPSULE BY MOUTH DAILY   90 capsule   1   . doxycycline (VIBRAMYCIN) 100 MG capsule   Oral   Take 1 capsule (100 mg total) by mouth 2 (two) times daily.   20 capsule   0   .  escitalopram (LEXAPRO) 5 MG tablet   Oral   Take 20 mg by mouth daily.         Marland Kitchen guaiFENesin-codeine (CHERATUSSIN AC) 100-10 MG/5ML syrup   Oral   Take 5 mLs by mouth every 4 (four) hours as needed for cough or congestion. Take one teaspoon every 6 hours as needed for cough and congestion   120 mL   0   . loratadine (CLARITIN) 10 MG tablet      1/2 tablet daily as needed for drainage   10 tablet   0   . LORazepam (ATIVAN) 0.5 MG tablet   Oral   Take 0.5 mg by mouth every 6 (six) hours as needed.         . methylPREDNISolone (MEDROL DOSEPAK) 4 MG tablet      follow package directions   21 tablet   0   . Multiple Vitamins-Minerals (CENTRUM SILVER) tablet   Oral   Take 1 tablet by mouth daily.         . Omega-3 Fatty Acids (FISH OIL) 1200 MG CAPS   Oral   Take 2 capsules by  mouth daily.            BP 139/84  Pulse 93  Temp(Src) 98 F (36.7 C) (Oral)  Resp 26  SpO2 99%  Physical Exam  Nursing note and vitals reviewed. Constitutional: He is oriented to person, place, and time. He appears well-developed and well-nourished. No distress.  HENT:  Right Ear: External ear normal.  Left Ear: External ear normal.  Oropharynx with minor erythema in a streaky fashion. No swelling. No exudates.  Eyes: Conjunctivae and EOM are normal.  Neck: Normal range of motion. Neck supple.  Cardiovascular: Normal rate, regular rhythm and normal heart sounds.   Pulmonary/Chest: Effort normal. No respiratory distress. He has no wheezes. He has no rales.  Rare, intermittent brief coarseness on inspiration heard anteriorly. Mild tachypnea.  Abdominal: Soft. He exhibits no distension. There is no tenderness.  Musculoskeletal: Normal range of motion. He exhibits no edema.  Lymphadenopathy:    He has no cervical adenopathy.  Neurological: He is alert and oriented to person, place, and time.  Skin: Skin is warm and dry. No rash noted.  Psychiatric: He has a normal mood and affect.    ED Course  Procedures (including critical care time)  Labs Reviewed - No data to display Dg Chest 2 View  10/28/2012  *RADIOLOGY REPORT*  Clinical Data: Cough.  Emphysema.  CHEST - 2 VIEW  Comparison: 10/28/2012  Findings: Ventriculoperitoneal shunt extends in the right neck along the right chest and into the abdomen. No visible discontinuity.  There is evidence old granulomatous disease.  Vague nodularity at the left lung base appears to likely represent nipple shadow  Atherosclerotic aortic arch noted.  Heart size within normal limits. Attenuated peripheral pulmonary vasculature is compatible with emphysema/COPD.  No pleural effusion noted.  Mild thoracic spondylosis is present.  IMPRESSION:  1.  Emphysema. 2.  Atherosclerosis. 3.  Old granulomatous disease.   Original Report Authenticated By:  Gaylyn Rong, M.D.       1. Cough, persistent   2. PND (post-nasal drip)   3. COPD exacerbation       MDM  The most plausible explanation for his cough is PND. He is collecting this drainage in his lower airway and the trachea and he has been unable to get it satisfactorily. Chest x-ray is as above with no acute findings. His lung  exam was essentially negative without adventitious sounds. He does not exhibit shortness of breath even with slow walking. There is no fever, chills or hypoxemia. He states he is quite anxious to go home.  We will treat with Claritin 5 mg by mouth daily when necessary drainage Medrol Dosepak Doxycycline thickly with inability to clear all secretions associated with age and COPD And Robitussin with codeine 1 teaspoon every 6 hours when necessary cough. If he develops fever, chills, shortness of breath or any worsening of call he should go to emergency department. Otherwise, he should call his PCP today for follow up appointment verbally on Monday. Which is in 4 days. He is stable at discharge. In addition he and his caretaker states that he is not currently taking diltiazem.         Hayden Rasmussen, NP 10/28/12 1356

## 2013-10-14 ENCOUNTER — Ambulatory Visit (INDEPENDENT_AMBULATORY_CARE_PROVIDER_SITE_OTHER): Payer: Medicare Other | Admitting: Ophthalmology

## 2013-10-14 DIAGNOSIS — I10 Essential (primary) hypertension: Secondary | ICD-10-CM

## 2013-10-14 DIAGNOSIS — H35379 Puckering of macula, unspecified eye: Secondary | ICD-10-CM

## 2013-10-14 DIAGNOSIS — H35039 Hypertensive retinopathy, unspecified eye: Secondary | ICD-10-CM

## 2013-10-14 DIAGNOSIS — H3530 Unspecified macular degeneration: Secondary | ICD-10-CM

## 2013-10-14 DIAGNOSIS — H43819 Vitreous degeneration, unspecified eye: Secondary | ICD-10-CM

## 2014-11-03 ENCOUNTER — Ambulatory Visit (INDEPENDENT_AMBULATORY_CARE_PROVIDER_SITE_OTHER): Payer: Medicare Other | Admitting: Ophthalmology

## 2014-11-16 ENCOUNTER — Ambulatory Visit (INDEPENDENT_AMBULATORY_CARE_PROVIDER_SITE_OTHER): Payer: Medicare Other | Admitting: Ophthalmology

## 2014-11-17 ENCOUNTER — Ambulatory Visit (INDEPENDENT_AMBULATORY_CARE_PROVIDER_SITE_OTHER): Payer: Medicare Other | Admitting: Ophthalmology

## 2014-11-17 DIAGNOSIS — H35033 Hypertensive retinopathy, bilateral: Secondary | ICD-10-CM | POA: Diagnosis not present

## 2014-11-17 DIAGNOSIS — H43813 Vitreous degeneration, bilateral: Secondary | ICD-10-CM | POA: Diagnosis not present

## 2014-11-17 DIAGNOSIS — I10 Essential (primary) hypertension: Secondary | ICD-10-CM

## 2014-11-17 DIAGNOSIS — H3531 Nonexudative age-related macular degeneration: Secondary | ICD-10-CM | POA: Diagnosis not present

## 2015-05-11 ENCOUNTER — Ambulatory Visit (INDEPENDENT_AMBULATORY_CARE_PROVIDER_SITE_OTHER): Payer: Medicare Other | Admitting: Podiatry

## 2015-05-11 ENCOUNTER — Encounter: Payer: Self-pay | Admitting: Podiatry

## 2015-05-11 VITALS — BP 126/82 | HR 94 | Resp 16 | Ht 69.5 in | Wt 157.0 lb

## 2015-05-11 DIAGNOSIS — L600 Ingrowing nail: Secondary | ICD-10-CM | POA: Diagnosis not present

## 2015-05-11 NOTE — Patient Instructions (Signed)

## 2015-05-11 NOTE — Progress Notes (Signed)
   Subjective:    Patient ID: [name]., male    DOB: [birth year], 81 y.o.   MRN: [id number]  HPI Patient presents with an ingrown toenail in their left foot, great toe- lateral side; x1 month.   Review of Systems  HENT: Positive for hearing loss.   Eyes: Positive for visual disturbance.  Respiratory: Positive for shortness of breath.   Endocrine: Positive for polyuria.  Genitourinary: Positive for urgency.  Musculoskeletal: Positive for gait problem.  Psychiatric/Behavioral: The patient is nervous/anxious.   All other systems reviewed and are negative.      Objective:   Physical Exam        Assessment & Plan:

## 2015-05-11 NOTE — Progress Notes (Signed)
Subjective:     Patient ID: [name]., male   DOB: August 29, 1924, 81 y.o.   MRN: [id number]  HPI patient presents with a painful ingrown toenail left big toe lateral border that he cannot cut with damage to the underlying structure. He has not noted drainage   Review of Systems  All other systems reviewed and are negative.      Objective:   Physical Exam  Constitutional: He is oriented to person, place, and time.  Cardiovascular: Intact distal pulses.   Musculoskeletal: Normal range of motion.  Neurological: He is oriented to person, place, and time.  Skin: Skin is warm and dry.  Nursing note and vitals reviewed.  neurovascular status found to be intact muscle strength adequate with patient having a very incurvated left hallux nailbed painful mostly on the lateral corner with slight medial discomfort and thickness of the nailbed also noted with no distal drainage     Assessment:     Ingrown toenail deformity left hallux lateral border with pain    Plan:     Reviewed condition and recommended temporary type correction. Patient wants procedure and I explained risk and today I infiltrated the left hallux 60 Milligan times like Marcaine mixture removed the corner removed all proud flesh abscess tissue and allowing channel for drainage and applied dressing. Reappoint to recheck as needed

## 2015-05-15 ENCOUNTER — Telehealth: Payer: Self-pay | Admitting: *Deleted

## 2015-05-15 NOTE — Telephone Encounter (Signed)
Called patient at [phone] (Home #) and talked to patient's wife. She stated, "[name]'s toe is not hurting at all". Pt is not in any pain.

## 2015-11-12 ENCOUNTER — Ambulatory Visit (INDEPENDENT_AMBULATORY_CARE_PROVIDER_SITE_OTHER): Payer: Medicare Other | Admitting: Ophthalmology

## 2015-11-12 DIAGNOSIS — I10 Essential (primary) hypertension: Secondary | ICD-10-CM

## 2015-11-12 DIAGNOSIS — H35033 Hypertensive retinopathy, bilateral: Secondary | ICD-10-CM | POA: Diagnosis not present

## 2015-11-12 DIAGNOSIS — H353134 Nonexudative age-related macular degeneration, bilateral, advanced atrophic with subfoveal involvement: Secondary | ICD-10-CM

## 2015-11-12 DIAGNOSIS — H43813 Vitreous degeneration, bilateral: Secondary | ICD-10-CM

## 2015-11-19 ENCOUNTER — Ambulatory Visit (INDEPENDENT_AMBULATORY_CARE_PROVIDER_SITE_OTHER): Payer: Medicare Other | Admitting: Ophthalmology

## 2016-11-17 ENCOUNTER — Ambulatory Visit (INDEPENDENT_AMBULATORY_CARE_PROVIDER_SITE_OTHER): Payer: Medicare Other | Admitting: Ophthalmology

## 2017-01-15 ENCOUNTER — Encounter: Payer: Self-pay | Admitting: Podiatry

## 2017-01-15 ENCOUNTER — Ambulatory Visit (INDEPENDENT_AMBULATORY_CARE_PROVIDER_SITE_OTHER): Payer: Medicare Other | Admitting: Podiatry

## 2017-01-15 DIAGNOSIS — B351 Tinea unguium: Secondary | ICD-10-CM

## 2017-01-15 NOTE — Progress Notes (Signed)
Subjective:    Patient ID: [name]., male   DOB: 83 y.o.   MRN: [id number]   HPI patient presents with elongated thick nails that they cannot cut of both feet    ROS      Objective:  Physical Exam neurovascular status unchanged with thick nailbeds 1-5 both feet     Assessment:   Mycotic nail infections 1-5 both feet      Plan:    Debridement thick brittle nailbeds 1-5 both feet

## 2017-01-21 ENCOUNTER — Ambulatory Visit (INDEPENDENT_AMBULATORY_CARE_PROVIDER_SITE_OTHER): Payer: Medicare Other | Admitting: Ophthalmology

## 2017-01-26 ENCOUNTER — Ambulatory Visit (INDEPENDENT_AMBULATORY_CARE_PROVIDER_SITE_OTHER): Payer: Medicare Other | Admitting: Ophthalmology

## 2017-01-26 DIAGNOSIS — H35033 Hypertensive retinopathy, bilateral: Secondary | ICD-10-CM

## 2017-01-26 DIAGNOSIS — I10 Essential (primary) hypertension: Secondary | ICD-10-CM | POA: Diagnosis not present

## 2017-01-26 DIAGNOSIS — H353134 Nonexudative age-related macular degeneration, bilateral, advanced atrophic with subfoveal involvement: Secondary | ICD-10-CM

## 2017-01-26 DIAGNOSIS — H43813 Vitreous degeneration, bilateral: Secondary | ICD-10-CM | POA: Diagnosis not present

## 2017-04-02 ENCOUNTER — Ambulatory Visit (INDEPENDENT_AMBULATORY_CARE_PROVIDER_SITE_OTHER): Payer: Medicare Other | Admitting: Podiatry

## 2017-04-02 DIAGNOSIS — B351 Tinea unguium: Secondary | ICD-10-CM

## 2017-04-02 DIAGNOSIS — M79675 Pain in left toe(s): Secondary | ICD-10-CM

## 2017-04-02 DIAGNOSIS — M79674 Pain in right toe(s): Secondary | ICD-10-CM | POA: Diagnosis not present

## 2017-04-02 NOTE — Progress Notes (Signed)
Subjective:    Patient ID: [name]., male   DOB: 83 y.o.   MRN: [id number]   HPI patient presents with thick painful nailbeds 1-5 both feet that he cannot take care of. He is with caregiver today    ROS      Objective:  Physical Exam neurovascular status unchanged with thick yellow brittle nailbeds 1-5 both feet that are painful     Assessment:  Mycotic nail infection with pain 1-5 both feet      Plan:  Debris painful nailbeds 1-5 both feet with no iatrogenic bleeding noted

## 2017-07-07 ENCOUNTER — Ambulatory Visit: Payer: Self-pay | Admitting: Podiatry

## 2017-07-15 ENCOUNTER — Encounter: Payer: Self-pay | Admitting: Podiatry

## 2017-07-15 ENCOUNTER — Ambulatory Visit: Payer: Medicare Other | Admitting: Podiatry

## 2017-07-15 VITALS — BP 169/74 | HR 84

## 2017-07-15 DIAGNOSIS — B351 Tinea unguium: Secondary | ICD-10-CM | POA: Diagnosis not present

## 2017-07-15 DIAGNOSIS — M79675 Pain in left toe(s): Secondary | ICD-10-CM | POA: Diagnosis not present

## 2017-07-15 DIAGNOSIS — Z872 Personal history of diseases of the skin and subcutaneous tissue: Secondary | ICD-10-CM

## 2017-07-15 DIAGNOSIS — M79674 Pain in right toe(s): Secondary | ICD-10-CM

## 2017-07-15 NOTE — Progress Notes (Signed)
SUBJECTIVE: 83 y.o. year old male presents using walker complaining of painful nails. Patient was accompanied by his care taker.   Review of Systems  Constitutional: Negative.   HENT: Negative for hearing loss.        Patient is hard of hearing.  Eyes: Negative.   Respiratory: Negative.   Cardiovascular: Negative.   Gastrointestinal: Negative.   Skin: Negative.    OBJECTIVE: DERMATOLOGIC EXAMINATION: Thick yellow dystrophic nails x 10. Painful ingrown hallucal nails both great toes. No open skin lesions noted.  VASCULAR EXAMINATION OF LOWER LIMBS: Pedal pulses are not palpable both DP and PT bilateral. Capillary Filling times within 3 seconds in all digits.  Positive for bilateral pedal and ankle edema. Temperature gradient from tibial crest to dorsum of foot is within normal bilateral.  NEUROLOGIC EXAMINATION OF THE LOWER LIMBS: Achilles DTR is present and within normal. Monofilament (Semmes-Weinstein 10-gm) sensory testing positive 6 out of 6, bilateral. Vibratory sensations(128Hz  turning fork) intact at medial and lateral forefoot bilateral.  Sharp and Dull discriminatory sensations at the plantar ball of hallux is intact bilateral.   MUSCULOSKELETAL EXAMINATION: Positive for hyperextended great toes and contracted lesser digits bilateral.   ASSESSMENT: Onychomycosis x 10. Ingrown hallucal nails  Bilateral. Pain in both great toes.  PLAN: Reviewed clinical findings and available treatment options. All nails debrided. May return in 3 months.

## 2017-07-15 NOTE — Patient Instructions (Signed)
Seen for hypertrophic nails. All nails debrided. Return in 3 months or as needed.  

## 2017-07-16 ENCOUNTER — Ambulatory Visit: Payer: Self-pay | Admitting: Podiatry

## 2017-09-03 ENCOUNTER — Emergency Department (HOSPITAL_COMMUNITY)
Admission: EM | Admit: 2017-09-03 | Discharge: 2017-09-04 | Disposition: A | Discharge status: Skilled Nursing Facility | Payer: Medicare Other | Attending: Emergency Medicine | Admitting: Emergency Medicine

## 2017-09-03 ENCOUNTER — Other Ambulatory Visit: Payer: Self-pay

## 2017-09-03 ENCOUNTER — Encounter (HOSPITAL_COMMUNITY): Payer: Self-pay | Admitting: Nurse Practitioner

## 2017-09-03 DIAGNOSIS — I10 Essential (primary) hypertension: Secondary | ICD-10-CM | POA: Insufficient documentation

## 2017-09-03 DIAGNOSIS — E119 Type 2 diabetes mellitus without complications: Secondary | ICD-10-CM | POA: Diagnosis not present

## 2017-09-03 DIAGNOSIS — Z87891 Personal history of nicotine dependence: Secondary | ICD-10-CM | POA: Diagnosis not present

## 2017-09-03 DIAGNOSIS — I2510 Atherosclerotic heart disease of native coronary artery without angina pectoris: Secondary | ICD-10-CM | POA: Diagnosis not present

## 2017-09-03 DIAGNOSIS — Z046 Encounter for general psychiatric examination, requested by authority: Secondary | ICD-10-CM | POA: Diagnosis present

## 2017-09-03 DIAGNOSIS — Z79899 Other long term (current) drug therapy: Secondary | ICD-10-CM | POA: Insufficient documentation

## 2017-09-03 DIAGNOSIS — Z7982 Long term (current) use of aspirin: Secondary | ICD-10-CM | POA: Insufficient documentation

## 2017-09-03 DIAGNOSIS — J449 Chronic obstructive pulmonary disease, unspecified: Secondary | ICD-10-CM | POA: Insufficient documentation

## 2017-09-03 DIAGNOSIS — F4321 Adjustment disorder with depressed mood: Secondary | ICD-10-CM | POA: Diagnosis not present

## 2017-09-03 DIAGNOSIS — Z634 Disappearance and death of family member: Secondary | ICD-10-CM | POA: Diagnosis not present

## 2017-09-03 LAB — COMPREHENSIVE METABOLIC PANEL
ALBUMIN: 3.1 g/dL — AB (ref 3.5–5.0)
ALT: 24 U/L (ref 17–63)
AST: 20 U/L (ref 15–41)
Alkaline Phosphatase: 68 U/L (ref 38–126)
Anion gap: 8 (ref 5–15)
BUN: 31 mg/dL — AB (ref 6–20)
CO2: 26 mmol/L (ref 22–32)
CREATININE: 1.19 mg/dL (ref 0.61–1.24)
Calcium: 8.6 mg/dL — ABNORMAL LOW (ref 8.9–10.3)
Chloride: 103 mmol/L (ref 101–111)
GFR calc Af Amer: >60 mL/min (ref >60)
GFR, EST NON AFRICAN AMERICAN: 52 mL/min — AB (ref >60)
GLUCOSE: 104 mg/dL — AB (ref 65–99)
POTASSIUM: 3.9 mmol/L (ref 3.5–5.1)
Sodium: 137 mmol/L (ref 135–145)
Total Bilirubin: 0.4 mg/dL (ref 0.3–1.2)
Total Protein: 5.9 g/dL — ABNORMAL LOW (ref 6.5–8.1)

## 2017-09-03 LAB — CBC WITH DIFFERENTIAL/PLATELET
Basophils Absolute: 0 10*3/uL (ref 0.0–0.1)
Basophils Relative: 0 %
EOS PCT: 3 %
Eosinophils Absolute: 0.2 10*3/uL (ref 0.0–0.7)
HEMATOCRIT: 33.7 % — AB (ref 39.0–52.0)
Hemoglobin: 11.1 g/dL — ABNORMAL LOW (ref 13.0–17.0)
LYMPHS PCT: 24 %
Lymphs Abs: 1.8 10*3/uL (ref 0.7–4.0)
MCH: 30.2 pg (ref 26.0–34.0)
MCHC: 32.9 g/dL (ref 30.0–36.0)
MCV: 91.6 fL (ref 78.0–100.0)
MONO ABS: 1 10*3/uL (ref 0.1–1.0)
MONOS PCT: 14 %
NEUTROS ABS: 4.3 10*3/uL (ref 1.7–7.7)
Neutrophils Relative %: 59 %
Platelets: 213 10*3/uL (ref 150–400)
RBC: 3.68 MIL/uL — ABNORMAL LOW (ref 4.22–5.81)
RDW: 15 % (ref 11.5–15.5)
WBC: 7.3 10*3/uL (ref 4.0–10.5)

## 2017-09-03 LAB — ETHANOL: Alcohol, Ethyl (B): <10 mg/dL (ref <10)

## 2017-09-03 MED ORDER — DILTIAZEM HCL ER COATED BEADS 180 MG PO CP24
180.0000 mg | ORAL_CAPSULE | Freq: Every day | ORAL | Status: DC
  Administered 2017-09-04: 180 mg via ORAL
  Filled 2017-09-03: qty 1

## 2017-09-03 NOTE — ED Provider Notes (Signed)
[facility] DEPT Provider Note   CSN: [id number] Arrival date & time: 09/03/17  1842     History   Chief Complaint Chief Complaint  Patient presents with  . Depression    HPI [name]. is a 84 y.o. male with a past medical history of CAD, COPD, diabetes, hypertension, hyperlipidemia, who presents to ED for evaluation of suicidal ideations.  He was speaking to 1 of his friends when he said something involving shooting himself.  States that his wife died about 2 weeks ago when he has been upset because he feels that "no one listens to me."  He also states that "I have made comments like that before, and people have just told me to stop."  He currently denies any SI, HI, auditory or visual hallucinations.  He tells me that he only made those comments out of frustration.  However, the staff at the nursing facility felt like he needed to be evaluated.  He denies any injuries, falls, headaches, vision changes, new medications or other symptoms at this time.  He denies any previous suicide attempts.  Denies any medication ingestions.  HPI  Past Medical History:  Diagnosis Date  . Adenomatous polyps 2003, 2006, 2007  . Bladder cancer ([place])   . CAD (coronary artery disease)   . COPD (chronic obstructive pulmonary disease) ([place])   . Depression   . Diverticulosis 07/2006  . DM (diabetes mellitus) ([place])   . Hemorrhoids, external   . Hemorrhoids, internal   . History of prostate cancer   . HLD (hyperlipidemia)   . HTN (hypertension)   . Iron deficiency anemia   . Macular degeneration   . Normal pressure hydrocephalus   . 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 NORMAL PRESSURE HYDROCEPHALUS 04/12/2010  . DEPRESSION 12/15/2007  . DIABETES MELLITUS, TYPE II 05/10/2007  . HYPERLIPIDEMIA 05/10/2007  . HYPERTENSION 05/10/2007  . CORONARY ARTERY DISEASE 05/10/2007  . COPD 05/10/2007    Past Surgical  History:  Procedure Laterality Date  . APPENDECTOMY    . BLADDER SURGERY     cancer  . CATARACT EXTRACTION     with lens implants  . COLONOSCOPY  07/21/2006  . NEPHRECTOMY    . TONSILLECTOMY    . URETHRA SURGERY     stricture  . 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 Authorizing Provider  albuterol (2.5 MG/3ML) 0.083% NEBU 3 mL, albuterol (5 MG/ML) 0.5% NEBU 0.5 mL Inhale 5 mg into the lungs every 6 (six) hours as needed (wheezing/sob).   Yes [provider]  aspirin 81 MG tablet Take 81 mg by mouth daily.   Yes [provider]  busPIRone (BUSPAR) 5 MG tablet Take 5 mg by mouth 2 (two) times daily.    Yes [provider]  Cholecalciferol (VITAMIN D) 2000 UNITS CAPS Take 1 capsule by mouth daily.    Yes [provider]  diltiazem (CARDIZEM CD) 180 MG 24 hr capsule TAKE 1 CAPSULE BY MOUTH DAILY 03/27/11  Yes Plotnikov, Evie Lacks, MD  escitalopram (LEXAPRO) 20 MG tablet Take 20 mg by mouth every morning. 04/30/17  Yes [provider]  guaiFENesin (MUCINEX) 600 MG 12 hr tablet Take 600 mg by mouth daily.   Yes [provider]  LORazepam (ATIVAN) 0.5 MG tablet Take  0.5 mg by mouth 2 (two) times daily.    Yes [provider]  mirtazapine (REMERON) 7.5 MG tablet Take 7.5 mg by mouth at bedtime.   Yes [provider]  Multiple Vitamins-Minerals (PRESERVISION AREDS PO) Take 1 tablet by mouth 2 (two) times daily.   Yes [provider]  Omega-3 Fatty Acids (FISH OIL) 1200 MG CAPS Take 1 capsule by mouth 2 (two) times daily.    Yes [provider]  ondansetron (ZOFRAN-ODT) 4 MG disintegrating tablet Take 4 mg by mouth every 6 (six) hours as needed for nausea or vomiting.   Yes [provider]  sertraline (ZOLOFT) 50 MG tablet Take 50 mg by mouth daily.   Yes [provider]  traZODone  (DESYREL) 50 MG tablet TAKE 1 PO TABLET AT BEDTIME 05/23/17  Yes [provider]  ZINC OXIDE, TOPICAL, 10 % CREA Apply topically.    [provider]  budesonide-formoterol (SYMBICORT) 80-4.5 MCG/ACT inhaler Inhale 2 puffs into the lungs 2 (two) times daily.  10/22/11  [provider]  buPROPion (WELLBUTRIN SR) 100 MG 12 hr tablet Take 100 mg by mouth every morning.  10/22/11  [provider]  tiotropium (SPIRIVA) 18 MCG inhalation capsule Place 18 mcg into inhaler and inhale daily.  10/22/11  [provider]    Family History Family History  Problem Relation Age of Onset  . Coronary artery disease Father     Social History Social History   Tobacco Use  . Smoking status: Former Smoker    Types: Cigarettes  . Smokeless tobacco: Never Used  Substance Use Topics  . Alcohol use: No  . Drug use: No     Allergies   Iron; Mirtazapine; and Sulfonamide derivatives   Review of Systems Review of Systems  Constitutional: Negative for appetite change, chills and fever.  HENT: Negative for ear pain, rhinorrhea, sneezing and sore throat.   Eyes: Negative for photophobia and visual disturbance.  Respiratory: Negative for cough, chest tightness, shortness of breath and wheezing.   Cardiovascular: Negative for chest pain and palpitations.  Gastrointestinal: Negative for abdominal pain, blood in stool, constipation, diarrhea, nausea and vomiting.  Genitourinary: Negative for dysuria, hematuria and urgency.  Musculoskeletal: Negative for myalgias.  Skin: Negative for rash.  Neurological: Negative for dizziness, weakness and light-headedness.  Psychiatric/Behavioral: Positive for behavioral problems and suicidal ideas. Negative for confusion and dysphoric mood. The patient is not nervous/anxious.      Physical Exam Updated Vital Signs BP (!) 146/66   Pulse 64   Temp 98.5 F (36.9 C) (Oral)   Resp 18   SpO2 98%   Physical Exam    Constitutional: He appears well-developed and well-nourished. No distress.  Elderly appearing male.  Does appear somewhat agitated and angry that he is in the situation.  HENT:  Head: Normocephalic and atraumatic.  Nose: Nose normal.  Eyes: Conjunctivae and EOM are normal. Left eye exhibits no discharge. No scleral icterus.  Neck: Normal range of motion. Neck supple.  Cardiovascular: Normal rate, regular rhythm, normal heart sounds and intact distal pulses. Exam reveals no gallop and no friction rub.  No murmur heard. Pulmonary/Chest: Effort normal and breath sounds normal. No respiratory distress.  Abdominal: Soft. Bowel sounds are normal. He exhibits no distension. There is no tenderness. There is no guarding.  Musculoskeletal: Normal range of motion. He exhibits no edema.  Neurological: He is alert. He exhibits normal muscle tone. Coordination normal.  Skin: Skin is warm and dry. No  rash noted.  Psychiatric: His affect is angry. His speech is not delayed. He is agitated. He does not exhibit a depressed mood. He expresses no homicidal and no suicidal ideation. He expresses no suicidal plans and no homicidal plans.  Nursing note and vitals reviewed.    ED Treatments / Results  Labs (all labs ordered are listed, but only abnormal results are displayed) Labs Reviewed  COMPREHENSIVE METABOLIC PANEL  ETHANOL  RAPID URINE DRUG SCREEN, HOSP PERFORMED  CBC WITH DIFFERENTIAL/PLATELET  URINALYSIS, ROUTINE W REFLEX MICROSCOPIC    EKG  EKG Interpretation None       Radiology No results found.  Procedures Procedures (including critical care time)  Medications Ordered in ED Medications  diltiazem (CARDIZEM CD) 24 hr capsule 180 mg (not administered)     Initial Impression / Assessment and Plan / ED Course  I have reviewed the triage vital signs and the nursing notes.  Pertinent labs & imaging results that were available during my care of the patient were reviewed by me and  considered in my medical decision making (see chart for details).     Patient presents to ED for evaluation of suicidal ideations.  States that he told his friend that he would shoot himself.  He currently denies any SI, HI, auditory or visual hallucinations.  He states that since the loss of his wife approximately 2 weeks ago, he has been stating vague suicidal ideations.  He states some vague suicidal ideations even before her death.  He tells me that he does not have any prior suicide attempts.  After TTS was consulted, it appears that patient has had several suicide attempts in the past initially starting at age 66 when he laid down on the railroad tracks and try to kill himself.  He does not appear confused at this time.  He is appear very agitated and states constantly states that he wants to leave" mild orders will be here to see the wall TTS recommends overnight observation and evaluation by stress in the a.m.  Patient refuses to stay here in the ED so will IVC him.  Will obtain medical screening lab work and have psychiatrist evaluate him in the morning.  Will dispo accordingly.  Patient discussed with and seen by Dr. Wilson Singer.  Dr. Wilson Singer filled out IVC paperwork.  Final Clinical Impressions(s) / ED Diagnoses   Final diagnoses:  None    ED Discharge Orders    None     Portions of this note were generated with Dragon dictation software. Dictation errors may occur despite best attempts at proofreading.    Delia Heady, PA-C 09/03/17 2222    Virgel Manifold, MD 09/05/17 [place] [place]

## 2017-09-03 NOTE — ED Triage Notes (Signed)
Patient was brought in by EMS from [facility]. Patient made a statement about shooting himself and so the called EMS to transport to [facility]. Patient stated he is just upset about his wife dying and not really SI or ever gonna act on it but staff still wanted him transported.

## 2017-09-03 NOTE — ED Notes (Signed)
Bed: WA26 Expected date:  Expected time:  Means of arrival:  Comments: EMS-SI 

## 2017-09-03 NOTE — ED Notes (Signed)
Pt stated "I just want someone to tell me why I can't go home."  Pt informed may be able to leave after seeing psychiatrist in the a.m.  Pt verbalized understanding.

## 2017-09-03 NOTE — ED Notes (Signed)
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

## 2017-09-03 NOTE — ED Notes (Signed)
Patient urinated on himself and refuses to be changed.

## 2017-09-03 NOTE — BH Assessment (Addendum)
Assessment Note  Jose Fonda. is an 84 y.o. male who presents to the ED voluntarily. Pt reports his wife recently passed away about 2 weeks ago and he made a statement to a friend today that he "wanted to die." Pt denies that he is currently suicidal and denies that he has any current plans to commit suicide. Pt states he meant that he does not want to be alive if his wife is not alive and he would like to die. Pt denies HI and denies AVH at present. Pt states he used to see an OPT provider several years ago due to MDD but states he does not feel that it was helpful. Pt has been admitted to [facility] for inpt treatment due to MDD.    Upon review of the pt's hx, according to an EDP note completed by Norm Salt, MD on 11/03/2010 5:06 PM, the pt has a hx of suicide attempts and reported to the EDP at that time that he had attempted suicide "5 or 6 times, the first time when he was 84 years old and learned that his parents were divorcing by laying on the railroad tracks." Pt also reportedly made a suicidal gesture on 11/01/2010 based on documentation in the chart indicating that the pt's wife called to report the pt held a knife to himself gesturing that he wanted to commit suicide.   TTS reviewed this information with EDP Delia Heady, PA-C and Lindon Romp, NP.  Lindon Romp, NP recommends continued observation and to be reassessed in the AM by psych. Pt states he does not wish to stay in the ED and requests to be d/c immediately. EDP Delia Heady, PA-C states she will review with Dr. Wilson Singer, MD to discuss the possibility of IVC'ing the pt if he refuses to stay VOL.   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 Adenomatous polyps 2003, 2006, 2007  . Bladder cancer ([place])   . CAD (coronary artery disease)   . COPD (chronic obstructive pulmonary disease) ([place])   . Depression   . Diverticulosis 07/2006  . DM (diabetes mellitus)  ([place])   . Hemorrhoids, external   . Hemorrhoids, internal   . History of prostate cancer   . HLD (hyperlipidemia)   . HTN (hypertension)   . Iron deficiency anemia   . Macular degeneration   . Normal pressure hydrocephalus   . 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 APPENDECTOMY    . BLADDER SURGERY     cancer  . CATARACT EXTRACTION     with lens implants  . COLONOSCOPY  07/21/2006  . NEPHRECTOMY    . TONSILLECTOMY    . URETHRA SURGERY     stricture  . VENTRICULOPERITONEAL SHUNT  02/2010    Family History:  Family History  Problem Relation Age of Onset  . Coronary artery disease Father     Social History:  reports that he has quit smoking. His smoking use included cigarettes. he has never used smokeless tobacco. He reports that he does not drink alcohol or use drugs.  Additional Social History:  Alcohol / Drug Use Pain Medications: See MAR Prescriptions: See MAR Over the Counter: See MAR History of alcohol / drug use?: No history of alcohol / drug abuse  CIWA: CIWA-Ar BP: (!) 146/66 Pulse Rate: 64 COWS:    Allergies:  Allergies  Allergen Reactions  .  Iron Hives  . Mirtazapine     REACTION: too drowzy  . Sulfonamide Derivatives     REACTION: Hives    Home Medications:  (Not in a hospital admission)  OB/GYN Status:  No LMP for male patient.  General Assessment Data Location of Assessment: [facility] ED TTS Assessment: In system Is this a Tele or Face-to-Face Assessment?: Face-to-Face Is this an Initial Assessment or a Re-assessment for this encounter?: Initial Assessment Marital status: Widowed Is patient pregnant?: No Pregnancy Status: No Living Arrangements: Other (Comment)([facility]) Can pt return to current living arrangement?: Yes Admission Status: Voluntary Is patient capable of signing voluntary admission?: Yes Referral Source: Self/Family/Friend Insurance type: [facility] Living Arrangements: Other (Comment)([facility]) Name of Psychiatrist: none Name of Therapist: none  Education Status Is patient currently in school?: No Highest grade of school patient has completed: pt stated "a doctorate"  Risk to self with the past 6 months Suicidal Ideation: Yes-Currently Present Has patient been a risk to self within the past 6 months prior to admission? : No Suicidal Intent: No Has patient had any suicidal intent within the past 6 months prior to admission? : No Is patient at risk for suicide?: Yes(based on risk factors) Suicidal Plan?: No Has patient had any suicidal plan within the past 6 months prior to admission? : No Access to Means: No What has been your use of drugs/alcohol within the last 12 months?: denies Previous Attempts/Gestures: Yes How many times?: 6 Triggers for Past Attempts: Family contact, Other personal contacts Intentional Self Injurious Behavior: None Family Suicide History: No Recent stressful life event(s): Loss (Comment)(wife recently passed away) Persecutory voices/beliefs?: No Depression: Yes Depression Symptoms: Despondent, Loss of interest in usual pleasures, Feeling worthless/self pity Substance abuse history and/or treatment for substance abuse?: No Suicide prevention information given to non-admitted patients: Not applicable  Risk to Others within the past 6 months Homicidal Ideation: No Does patient have any lifetime risk of violence toward others beyond the six months prior to admission? : No Thoughts of Harm to Others: No Current Homicidal Intent: No Current Homicidal Plan: No Access to Homicidal Means: No History of harm to others?: No Assessment of Violence: None Noted Does patient have access to weapons?: No Criminal Charges Pending?: No Does patient have a court date: No Is patient on probation?: No  Psychosis Hallucinations: None noted Delusions: None noted  Mental Status  Report Appearance/Hygiene: Other (Comment), Poor hygiene(pt urinated on himself) Eye Contact: Good Motor Activity: Unremarkable Speech: Logical/coherent, Soft Level of Consciousness: Alert Mood: Depressed, Sad Affect: Depressed, Flat, Sad Anxiety Level: None Thought Processes: Relevant, Coherent Judgement: Partial Orientation: Person, Place, Situation, Appropriate for developmental age, Time Obsessive Compulsive Thoughts/Behaviors: None  Cognitive Functioning Concentration: Normal Memory: Recent Intact, Remote Impaired IQ: Average Insight: Fair Impulse Control: Good Appetite: Good Sleep: No Change Total Hours of Sleep: 7 Vegetative Symptoms: None  ADLScreening [facility] Assessment Services) Patient's cognitive ability adequate to safely complete daily activities?: Yes Patient able to express need for assistance with ADLs?: Yes Independently performs ADLs?: No  Prior Inpatient Therapy Prior Inpatient Therapy: Yes Prior Therapy Dates: 2012 Prior Therapy Facilty/Provider(s): [facility] Reason for Treatment: MDD  Prior Outpatient Therapy Prior Outpatient Therapy: Yes Prior Therapy Dates: pt does not recall Prior Therapy Facilty/Provider(s): pt does not recall Reason for Treatment: MDD Does patient have an ACCT team?: No Does patient have Intensive In-House Services?  : No Does patient have [facility] services? : No Does  patient have P4CC services?: No  ADL Screening (condition at time of admission) Patient's cognitive ability adequate to safely complete daily activities?: Yes Is the patient deaf or have difficulty hearing?: Yes Does the patient have difficulty seeing, even when wearing glasses/contacts?: Yes Does the patient have difficulty concentrating, remembering, or making decisions?: No Patient able to express need for assistance with ADLs?: Yes Does the patient have difficulty dressing or bathing?: Yes Independently performs ADLs?: No Communication:  Independent Dressing (OT): Needs assistance Is this a change from baseline?: Pre-admission baseline Grooming: Independent Feeding: Independent Bathing: Needs assistance Is this a change from baseline?: Pre-admission baseline Toileting: Needs assistance Is this a change from baseline?: Pre-admission baseline In/Out Bed: Needs assistance Is this a change from baseline?: Pre-admission baseline Walks in Home: Needs assistance Is this a change from baseline?: Pre-admission baseline Does the patient have difficulty walking or climbing stairs?: Yes Weakness of Legs: Both Weakness of Arms/Hands: None  Home Assistive Devices/Equipment Home Assistive Devices/Equipment: Environmental consultant (specify type)    Abuse/Neglect Assessment (Assessment to be complete while patient is alone) Abuse/Neglect Assessment Can Be Completed: Yes Physical Abuse: Denies Verbal Abuse: Denies Sexual Abuse: Denies Exploitation of patient/patient's resources: Denies Self-Neglect: Denies     Regulatory affairs officer (For Healthcare) Does Patient Have a Medical Advance Directive?: Yes Type of Advance Directive: Healthcare Power of [place], Living will(pt stated "Ronalee Belts" is his POA.) Copy of [place] in Chart?: No - copy requested Copy of Living Will in Chart?: No - copy requested    Additional Information 1:1 In Past 12 Months?: No CIRT Risk: No Elopement Risk: No Does patient have medical clearance?: Yes     Disposition: Per Lindon Romp, NP pt is recommended for overnight observation and to be reassessed in the AM by psychiatry. EDP Delia Heady, PA-C and pt's nurse Margaretha Sheffield, RN have been advised of the disposition.  Disposition Initial Assessment Completed for this Encounter: Yes Disposition of Patient: Re-evaluation by Psychiatry recommended(per Lindon Romp, NP)  On Site Evaluation by:   Reviewed with Physician:    Lyanne Co 09/03/2017 8:25 PM

## 2017-09-04 DIAGNOSIS — Z634 Disappearance and death of family member: Secondary | ICD-10-CM

## 2017-09-04 DIAGNOSIS — F4321 Adjustment disorder with depressed mood: Secondary | ICD-10-CM

## 2017-09-04 DIAGNOSIS — Z87891 Personal history of nicotine dependence: Secondary | ICD-10-CM | POA: Diagnosis not present

## 2017-09-04 LAB — URINALYSIS, ROUTINE W REFLEX MICROSCOPIC
BILIRUBIN URINE: NEGATIVE
Glucose, UA: NEGATIVE mg/dL
Hgb urine dipstick: NEGATIVE
Ketones, ur: NEGATIVE mg/dL
Nitrite: NEGATIVE
Protein, ur: NEGATIVE mg/dL
SPECIFIC GRAVITY, URINE: 1.016 (ref 1.005–1.030)
pH: 5 (ref 5.0–8.0)

## 2017-09-04 LAB — RAPID URINE DRUG SCREEN, HOSP PERFORMED
AMPHETAMINES: NOT DETECTED
Barbiturates: NOT DETECTED
Benzodiazepines: NOT DETECTED
COCAINE: NOT DETECTED
OPIATES: NOT DETECTED
TETRAHYDROCANNABINOL: NOT DETECTED

## 2017-09-04 NOTE — [facility] Suicide Risk Assessment (Signed)
Suicide Risk Assessment  Discharge Assessment   [facility] Discharge Suicide Risk Assessment   Principal Problem: Adjustment disorder with depressed mood Discharge Diagnoses:  Patient Active Problem List   Diagnosis Date Noted  . Adjustment disorder with depressed mood [F43.21] 09/04/2017    Priority: High  . NORMAL PRESSURE HYDROCEPHALUS [G91.0] 04/12/2010  . DEPRESSION [F32.9] 12/15/2007  . DIABETES MELLITUS, TYPE II [E11.9] 05/10/2007  . HYPERLIPIDEMIA [E78.5] 05/10/2007  . HYPERTENSION [I10] 05/10/2007  . CORONARY ARTERY DISEASE [I25.10] 05/10/2007  . COPD [J44.9] 05/10/2007    Total Time spent with patient: 45 minutes  Musculoskeletal: Strength & Muscle Tone: within normal limits Gait & Station: normal Patient leans: N/A  Psychiatric Specialty Exam: Physical Exam  Constitutional: He is oriented to person, place, and time. He appears well-developed and well-nourished.  HENT:  Head: Normocephalic.  Neck: Normal range of motion.  Respiratory: Effort normal.  Genitourinary: Prostate normal.  Musculoskeletal: Normal range of motion.  Neurological: He is alert and oriented to person, place, and time.  Psychiatric: His speech is normal and behavior is normal. Judgment and thought content normal. Cognition and memory are normal. He exhibits a depressed mood.    Review of Systems  Psychiatric/Behavioral: Positive for depression.  All other systems reviewed and are negative.   Blood pressure (!) 159/68, pulse 70, temperature 99 F (37.2 C), temperature source Oral, resp. rate 18, SpO2 92 %.There is no height or weight on file to calculate BMI.  General Appearance: Casual  Eye Contact:  Good  Speech:  Normal Rate  Volume:  Normal  Mood:  Depressed  Affect:  Congruent  Thought Process:  Coherent and Descriptions of Associations: Intact  Orientation:  Full (Time, Place, and Person)  Thought Content:  WDL and Logical  Suicidal Thoughts:  No  Homicidal Thoughts:  No  Memory:   Immediate;   Good Recent;   Good Remote;   Good  Judgement:  Fair  Insight:  Good  Psychomotor Activity:  Normal  Concentration:  Concentration: Good and Attention Span: Good  Recall:  Good  Fund of Knowledge:  Good  Language:  Good  Akathisia:  No  Handed:  Right  AIMS (if indicated):     Assets:  Housing Leisure Time Physical Health Resilience Social Support  ADL's:  Impaired  Cognition:  WNL  Sleep:      Mental Status Per Nursing Assessment::   On Admission:   depression  Demographic Factors:  Male, Age 14 or older and Caucasian  Loss Factors: Loss of significant relationship  Historical Factors: NA  Risk Reduction Factors:   Sense of responsibility to family, Positive social support and Positive therapeutic relationship  Continued Clinical Symptoms:  Depression, mild  Cognitive Features That Contribute To Risk:  None    Suicide Risk:  Minimal: No identifiable suicidal ideation.  Patients presenting with no risk factors but with morbid ruminations; may be classified as minimal risk based on the severity of the depressive symptoms    Plan Of Care/Follow-up recommendations:  Activity:  as tolerated Diet:  heart healthy diet  Jose Ulloa, NP 09/04/2017, 12:42 PM

## 2017-09-04 NOTE — Consult Note (Signed)
[facility] Consult   Reason for Consult:  Depression  Referring Physician:  EDP Patient Identification: [name]. MRN:  [id number] Principal Diagnosis: Adjustment disorder with depressed mood Diagnosis:   Patient Active Problem List   Diagnosis Date Noted  . Adjustment disorder with depressed mood [F43.21] 09/04/2017    Priority: High  . NORMAL PRESSURE HYDROCEPHALUS [G91.0] 04/12/2010  . DEPRESSION [F32.9] 12/15/2007  . DIABETES MELLITUS, TYPE II [E11.9] 05/10/2007  . HYPERLIPIDEMIA [E78.5] 05/10/2007  . HYPERTENSION [I10] 05/10/2007  . CORONARY ARTERY DISEASE [I25.10] 05/10/2007  . COPD [J44.9] 05/10/2007    Total Time spent with patient: 45 minutes  Subjective:   [name] Schunk. is a 84 y.o. male patient does not warrant admission.  HPI:  84 yo male who presented to the ED with depression over the loss of his wife two weeks ago.  He lives in a skilled nursing facility and made a remark about wanting to shoot himself but reports, "I was just mouthing off."  No access to guns, monitored in skilled nursing.  Today he does report depression over his wife's death and the past death of his daughter but denies suicidal/homicidal ideations, hallucinations, and substance abuse.  Stable to return to his facility.  Past Psychiatric History: depression  Risk to Self: None Risk to Others: Homicidal Ideation: No Thoughts of Harm to Others: No Current Homicidal Intent: No Current Homicidal Plan: No Access to Homicidal Means: No History of harm to others?: No Assessment of Violence: None Noted Does patient have access to weapons?: No Criminal Charges Pending?: No Does patient have a court date: No Prior Inpatient Therapy: Prior Inpatient Therapy: Yes Prior Therapy Dates: 2012 Prior Therapy Facilty/Provider(s): [facility] Reason for Treatment: MDD Prior Outpatient Therapy: Prior Outpatient Therapy: Yes Prior Therapy Dates: pt does not recall Prior  Therapy Facilty/Provider(s): pt does not recall Reason for Treatment: MDD Does patient have an ACCT team?: No Does patient have Intensive In-House Services?  : No Does patient have [facility] services? : No Does patient have P4CC services?: No  Past Medical History:  Past Medical History:  Diagnosis Date  . Adenomatous polyps 2003, 2006, 2007  . Bladder cancer ([place])   . CAD (coronary artery disease)   . COPD (chronic obstructive pulmonary disease) ([place])   . Depression   . Diverticulosis 07/2006  . DM (diabetes mellitus) ([place])   . Hemorrhoids, external   . Hemorrhoids, internal   . History of prostate cancer   . HLD (hyperlipidemia)   . HTN (hypertension)   . Iron deficiency anemia   . Macular degeneration   . Normal pressure hydrocephalus   . 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 APPENDECTOMY    . BLADDER SURGERY     cancer  . CATARACT EXTRACTION     with lens implants  . COLONOSCOPY  07/21/2006  . NEPHRECTOMY    . TONSILLECTOMY    . URETHRA SURGERY     stricture  . 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 Coronary artery disease Father    Family Psychiatric  History: none Social History:  Social History   Substance and Sexual Activity  Alcohol Use No     Social History   Substance and Sexual Activity  Drug Use No    Social History   Socioeconomic History  . Marital status: Married    Spouse name:  None  . Number of children: 3  . Years of education: None  . Highest education level: None  Social Needs  . Financial resource strain: None  . Food insecurity - worry: None  . Food insecurity - inability: None  . Transportation needs - medical: None  . Transportation needs - non-medical: None  Occupational History  . Occupation: Retired    Fish farm manager: RETIRED  Tobacco Use  . Smoking status: Former Smoker    Types: Cigarettes  . Smokeless tobacco:  Never Used  Substance and Sexual Activity  . Alcohol use: No  . Drug use: No  . Sexual activity: None  Other Topics Concern  . None  Social History Narrative  . None   Additional Social History: N/A    Allergies:   Allergies  Allergen Reactions  . Iron Hives  . Mirtazapine     REACTION: too drowzy  . Sulfonamide Derivatives     REACTION: Hives    Labs:  Results for orders placed or performed during the hospital encounter of 09/03/17 (from the past 48 hour(s))  Comprehensive metabolic panel     Status: Abnormal   Collection Time: 09/03/17 10:55 PM  Result Value Ref Range   Sodium 137 135 - 145 mmol/L   Potassium 3.9 3.5 - 5.1 mmol/L   Chloride 103 101 - 111 mmol/L   CO2 26 22 - 32 mmol/L   Glucose, Bld 104 (H) 65 - 99 mg/dL   BUN 31 (H) 6 - 20 mg/dL   Creatinine, Ser 1.19 0.61 - 1.24 mg/dL   Calcium 8.6 (L) 8.9 - 10.3 mg/dL   Total Protein 5.9 (L) 6.5 - 8.1 g/dL   Albumin 3.1 (L) 3.5 - 5.0 g/dL   AST 20 15 - 41 U/L   ALT 24 17 - 63 U/L   Alkaline Phosphatase 68 38 - 126 U/L   Total Bilirubin 0.4 0.3 - 1.2 mg/dL   GFR calc non Af Amer 52 (L) >60 mL/min   GFR calc Af Amer >60 >60 mL/min    Comment: (NOTE) The eGFR has been calculated using the CKD EPI equation. This calculation has not been validated in all clinical situations. eGFR's persistently <60 mL/min signify possible Chronic Kidney Disease.    Anion gap 8 5 - 15  Ethanol     Status: None   Collection Time: 09/03/17 10:55 PM  Result Value Ref Range   Alcohol, Ethyl (B) <10 <10 mg/dL    Comment:        LOWEST DETECTABLE LIMIT FOR SERUM ALCOHOL IS 10 mg/dL FOR MEDICAL PURPOSES ONLY   CBC with Diff     Status: Abnormal   Collection Time: 09/03/17 10:55 PM  Result Value Ref Range   WBC 7.3 4.0 - 10.5 K/uL   RBC 3.68 (L) 4.22 - 5.81 MIL/uL   Hemoglobin 11.1 (L) 13.0 - 17.0 g/dL   HCT 33.7 (L) 39.0 - 52.0 %   MCV 91.6 78.0 - 100.0 fL   MCH 30.2 26.0 - 34.0 pg   MCHC 32.9 30.0 - 36.0 g/dL   RDW 15.0  11.5 - 15.5 %   Platelets 213 150 - 400 K/uL   Neutrophils Relative % 59 %   Neutro Abs 4.3 1.7 - 7.7 K/uL   Lymphocytes Relative 24 %   Lymphs Abs 1.8 0.7 - 4.0 K/uL   Monocytes Relative 14 %   Monocytes Absolute 1.0 0.1 - 1.0 K/uL   Eosinophils Relative 3 %   Eosinophils Absolute 0.2  0.0 - 0.7 K/uL   Basophils Relative 0 %   Basophils Absolute 0.0 0.0 - 0.1 K/uL  Urine rapid drug screen (hosp performed)     Status: None   Collection Time: 09/04/17  1:09 AM  Result Value Ref Range   Opiates NONE DETECTED NONE DETECTED   Cocaine NONE DETECTED NONE DETECTED   Benzodiazepines NONE DETECTED NONE DETECTED   Amphetamines NONE DETECTED NONE DETECTED   Tetrahydrocannabinol NONE DETECTED NONE DETECTED   Barbiturates NONE DETECTED NONE DETECTED    Comment: (NOTE) DRUG SCREEN FOR MEDICAL PURPOSES ONLY.  IF CONFIRMATION IS NEEDED FOR ANY PURPOSE, NOTIFY LAB WITHIN 5 DAYS. LOWEST DETECTABLE LIMITS FOR URINE DRUG SCREEN Drug Class                     Cutoff (ng/mL) Amphetamine and metabolites    1000 Barbiturate and metabolites    200 Benzodiazepine                 254 Tricyclics and metabolites     300 Opiates and metabolites        300 Cocaine and metabolites        300 THC                            50   Urinalysis, Routine w reflex microscopic     Status: Abnormal   Collection Time: 09/04/17  1:09 AM  Result Value Ref Range   Color, Urine YELLOW YELLOW   APPearance CLEAR CLEAR   Specific Gravity, Urine 1.016 1.005 - 1.030   pH 5.0 5.0 - 8.0   Glucose, UA NEGATIVE NEGATIVE mg/dL   Hgb urine dipstick NEGATIVE NEGATIVE   Bilirubin Urine NEGATIVE NEGATIVE   Ketones, ur NEGATIVE NEGATIVE mg/dL   Protein, ur NEGATIVE NEGATIVE mg/dL   Nitrite NEGATIVE NEGATIVE   Leukocytes, UA SMALL (A) NEGATIVE   RBC / HPF 0-5 0 - 5 RBC/hpf   WBC, UA 6-30 0 - 5 WBC/hpf   Bacteria, UA RARE (A) NONE SEEN   Squamous Epithelial / LPF 0-5 (A) NONE SEEN   Mucus PRESENT     Current  Facility-Administered Medications  Medication Dose Route Frequency Provider Last Rate Last Dose  . diltiazem (CARDIZEM CD) 24 hr capsule 180 mg  180 mg Oral Daily Khatri, Hina, PA-C   180 mg at 09/04/17 0930   Current Outpatient Medications  Medication Sig Dispense Refill  . albuterol (2.5 MG/3ML) 0.083% NEBU 3 mL, albuterol (5 MG/ML) 0.5% NEBU 0.5 mL Inhale 5 mg into the lungs every 6 (six) hours as needed (wheezing/sob).    Marland Kitchen aspirin 81 MG tablet Take 81 mg by mouth daily.    . busPIRone (BUSPAR) 5 MG tablet Take 5 mg by mouth 2 (two) times daily.     . Cholecalciferol (VITAMIN D) 2000 UNITS CAPS Take 1 capsule by mouth daily.     Marland Kitchen diltiazem (CARDIZEM CD) 180 MG 24 hr capsule TAKE 1 CAPSULE BY MOUTH DAILY 90 capsule 1  . escitalopram (LEXAPRO) 20 MG tablet Take 20 mg by mouth every morning.  1  . guaiFENesin (MUCINEX) 600 MG 12 hr tablet Take 600 mg by mouth daily.    Marland Kitchen LORazepam (ATIVAN) 0.5 MG tablet Take 0.5 mg by mouth 2 (two) times daily.     . mirtazapine (REMERON) 7.5 MG tablet Take 7.5 mg by mouth at bedtime.    . Multiple Vitamins-Minerals (PRESERVISION AREDS  PO) Take 1 tablet by mouth 2 (two) times daily.    . Omega-3 Fatty Acids (FISH OIL) 1200 MG CAPS Take 1 capsule by mouth 2 (two) times daily.     . ondansetron (ZOFRAN-ODT) 4 MG disintegrating tablet Take 4 mg by mouth every 6 (six) hours as needed for nausea or vomiting.    . sertraline (ZOLOFT) 50 MG tablet Take 50 mg by mouth daily.    . traZODone (DESYREL) 50 MG tablet TAKE 1 PO TABLET AT BEDTIME  6  . ZINC OXIDE, TOPICAL, 10 % CREA Apply topically.      Musculoskeletal: Strength & Muscle Tone: within normal limits Gait & Station: normal Patient leans: N/A  Psychiatric Specialty Exam: Physical Exam  Nursing note and vitals reviewed. Constitutional: He is oriented to person, place, and time. He appears well-developed and well-nourished.  HENT:  Head: Normocephalic.  Neck: Normal range of motion.  Respiratory:  Effort normal.  Genitourinary: Prostate normal.  Musculoskeletal: Normal range of motion.  Neurological: He is alert and oriented to person, place, and time.  Psychiatric: His speech is normal and behavior is normal. Judgment and thought content normal. Cognition and memory are normal. He exhibits a depressed mood.    Review of Systems  Psychiatric/Behavioral: Positive for depression.  All other systems reviewed and are negative.   Blood pressure (!) 159/68, pulse 70, temperature 99 F (37.2 C), temperature source Oral, resp. rate 18, SpO2 92 %.There is no height or weight on file to calculate BMI.  General Appearance: Casual  Eye Contact:  Good  Speech:  Normal Rate  Volume:  Normal  Mood:  Depressed  Affect:  Congruent  Thought Process:  Coherent and Descriptions of Associations: Intact  Orientation:  Full (Time, Place, and Person)  Thought Content:  WDL and Logical  Suicidal Thoughts:  No  Homicidal Thoughts:  No  Memory:  Immediate;   Good Recent;   Good Remote;   Good  Judgement:  Fair  Insight:  Good  Psychomotor Activity:  Normal  Concentration:  Concentration: Good and Attention Span: Good  Recall:  Good  Fund of Knowledge:  Good  Language:  Good  Akathisia:  No  Handed:  Right  AIMS (if indicated):    N/A  Assets:  Housing Leisure Time Resilience Social Support  ADL's:  Impaired  Cognition:  WNL  Sleep:    N/A     Treatment Plan Summary: Daily contact with patient to assess and evaluate symptoms and progress in treatment, Medication management and Plan adjustment disorder with depressed mood:  -Crisis stabilization -Medication management:  Continue medical medications along with Zoloft 50 mg daily for depression, Lexapro 20 mg daily for depression, Zoloft 50 mg daily for depression, Remeron 7.5 mg at bedtime for sleep, Ativan 0.5 mg BID for anxiety, and Buspar 5 mg BID for anxiety. -Individual counseling  Disposition: No evidence of imminent risk to self  or others at present.    Waylan Boga, NP 09/04/2017 10:51 AM   Patient seen face-to-face for psychiatric evaluation, chart reviewed and case discussed with the physician extender and developed treatment plan. Reviewed the information documented and agree with the treatment plan.  Buford Dresser, DO

## 2017-09-04 NOTE — ED Notes (Signed)
Warm blanket provided.

## 2017-09-04 NOTE — Progress Notes (Signed)
IVC rescinded at 10:33AM

## 2017-09-04 NOTE — ED Notes (Signed)
Assisted patient to the bathroom to have a bowel movement, urine specimen obtained and sent to lab.  New brief applied, no other needs voiced at this time.

## 2017-09-04 NOTE — Progress Notes (Addendum)
CSW spoke with [facility]- patient is able to return to facility today. No needs from facility at this time. CSW updated patient and RN. [facility] contacted for transportation.  Kingsley Spittle, [facility] Emergency Room Clinical Social Worker [phone]

## 2017-09-11 ENCOUNTER — Emergency Department (HOSPITAL_COMMUNITY): Payer: Medicare Other

## 2017-09-11 ENCOUNTER — Other Ambulatory Visit: Payer: Self-pay

## 2017-09-11 ENCOUNTER — Encounter (HOSPITAL_COMMUNITY): Payer: Self-pay | Admitting: Emergency Medicine

## 2017-09-11 ENCOUNTER — Emergency Department (HOSPITAL_COMMUNITY)
Admission: EM | Admit: 2017-09-11 | Discharge: 2017-09-11 | Disposition: A | Discharge status: Home / Self Care | Payer: Medicare Other | Attending: Emergency Medicine | Admitting: Emergency Medicine

## 2017-09-11 DIAGNOSIS — Z87891 Personal history of nicotine dependence: Secondary | ICD-10-CM | POA: Diagnosis not present

## 2017-09-11 DIAGNOSIS — Z79899 Other long term (current) drug therapy: Secondary | ICD-10-CM | POA: Insufficient documentation

## 2017-09-11 DIAGNOSIS — Y92129 Unspecified place in nursing home as the place of occurrence of the external cause: Secondary | ICD-10-CM | POA: Diagnosis not present

## 2017-09-11 DIAGNOSIS — S91115A Laceration without foreign body of left lesser toe(s) without damage to nail, initial encounter: Secondary | ICD-10-CM | POA: Diagnosis present

## 2017-09-11 DIAGNOSIS — J449 Chronic obstructive pulmonary disease, unspecified: Secondary | ICD-10-CM | POA: Diagnosis not present

## 2017-09-11 DIAGNOSIS — Z7982 Long term (current) use of aspirin: Secondary | ICD-10-CM | POA: Insufficient documentation

## 2017-09-11 DIAGNOSIS — Y939 Activity, unspecified: Secondary | ICD-10-CM | POA: Insufficient documentation

## 2017-09-11 DIAGNOSIS — W1830XA Fall on same level, unspecified, initial encounter: Secondary | ICD-10-CM | POA: Insufficient documentation

## 2017-09-11 DIAGNOSIS — Z8551 Personal history of malignant neoplasm of bladder: Secondary | ICD-10-CM | POA: Insufficient documentation

## 2017-09-11 DIAGNOSIS — I2510 Atherosclerotic heart disease of native coronary artery without angina pectoris: Secondary | ICD-10-CM | POA: Insufficient documentation

## 2017-09-11 DIAGNOSIS — E119 Type 2 diabetes mellitus without complications: Secondary | ICD-10-CM | POA: Insufficient documentation

## 2017-09-11 DIAGNOSIS — Y999 Unspecified external cause status: Secondary | ICD-10-CM | POA: Insufficient documentation

## 2017-09-11 DIAGNOSIS — S93115A Dislocation of interphalangeal joint of left lesser toe(s), initial encounter: Secondary | ICD-10-CM | POA: Insufficient documentation

## 2017-09-11 DIAGNOSIS — I10 Essential (primary) hypertension: Secondary | ICD-10-CM | POA: Diagnosis not present

## 2017-09-11 DIAGNOSIS — S92912B Unspecified fracture of left toe(s), initial encounter for open fracture: Secondary | ICD-10-CM | POA: Insufficient documentation

## 2017-09-11 LAB — BASIC METABOLIC PANEL
Anion gap: 7 (ref 5–15)
BUN: 18 mg/dL (ref 6–20)
CALCIUM: 8.4 mg/dL — AB (ref 8.9–10.3)
CO2: 25 mmol/L (ref 22–32)
CREATININE: 1.01 mg/dL (ref 0.61–1.24)
Chloride: 105 mmol/L (ref 101–111)
GFR calc Af Amer: >60 mL/min (ref >60)
GFR calc non Af Amer: >60 mL/min (ref >60)
Glucose, Bld: 89 mg/dL (ref 65–99)
Potassium: 4.5 mmol/L (ref 3.5–5.1)
Sodium: 137 mmol/L (ref 135–145)

## 2017-09-11 LAB — CBC
HCT: 34.6 % — ABNORMAL LOW (ref 39.0–52.0)
Hemoglobin: 11.9 g/dL — ABNORMAL LOW (ref 13.0–17.0)
MCH: 31.1 pg (ref 26.0–34.0)
MCHC: 34.4 g/dL (ref 30.0–36.0)
MCV: 90.3 fL (ref 78.0–100.0)
Platelets: 203 10*3/uL (ref 150–400)
RBC: 3.83 MIL/uL — ABNORMAL LOW (ref 4.22–5.81)
RDW: 14.7 % (ref 11.5–15.5)
WBC: 6.9 10*3/uL (ref 4.0–10.5)

## 2017-09-11 MED ORDER — CEPHALEXIN 500 MG PO CAPS
500.0000 mg | ORAL_CAPSULE | Freq: Once | ORAL | Status: AC
Start: 2017-09-11 — End: 2017-09-11
  Administered 2017-09-11: 500 mg via ORAL
  Filled 2017-09-11: qty 1

## 2017-09-11 MED ORDER — LIDOCAINE-EPINEPHRINE (PF) 2 %-1:200000 IJ SOLN
20.0000 mL | Freq: Once | INTRAMUSCULAR | Status: AC
  Administered 2017-09-11: 20 mL
  Filled 2017-09-11: qty 20

## 2017-09-11 MED ORDER — CEFAZOLIN SODIUM-DEXTROSE 2-4 GM/100ML-% IV SOLN
2.0000 g | Freq: Once | INTRAVENOUS | Status: AC
  Administered 2017-09-11: 2 g via INTRAVENOUS
  Filled 2017-09-11: qty 100

## 2017-09-11 MED ORDER — CEPHALEXIN 500 MG PO CAPS: 500.0000 mg | ORAL_CAPSULE | Freq: Three times a day (TID) | ORAL | 0 refills | Status: AC

## 2017-09-11 NOTE — ED Provider Notes (Signed)
[facility] DEPT Provider Note   CSN: [id number] Arrival date & time: 09/11/17  0859     History   Chief Complaint Chief Complaint  Patient presents with  . Laceration    HPI [name]. is a 84 y.o. male.  HPI Patient is a 84 year old male presents the emergency department with fall 2 days ago.  Today staff at the facility noted bleeding coming from his left fourth toe and sent him to the emergency department for further evaluation.  The fall 2 days ago was mechanical which point he fell backwards.  He did strike his head.  He has no headache and reports no loss consciousness.  No use of anticoagulants.  No fevers or chills.  He reports pain in the left foot and left fourth toe.  Pain is mild to moderate in severity worse with palpation.  No other complaints at this time.   Past Medical History:  Diagnosis Date  . Adenomatous polyps 2003, 2006, 2007  . Bladder cancer ([place])   . CAD (coronary artery disease)   . COPD (chronic obstructive pulmonary disease) ([place])   . Depression   . Diverticulosis 07/2006  . DM (diabetes mellitus) ([place])   . Hemorrhoids, external   . Hemorrhoids, internal   . History of prostate cancer   . HLD (hyperlipidemia)   . HTN (hypertension)   . Iron deficiency anemia   . Macular degeneration   . Normal pressure hydrocephalus   . 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 Adjustment disorder with depressed mood 09/04/2017  . NORMAL PRESSURE HYDROCEPHALUS 04/12/2010  . DEPRESSION 12/15/2007  . DIABETES MELLITUS, TYPE II 05/10/2007  . HYPERLIPIDEMIA 05/10/2007  . HYPERTENSION 05/10/2007  . CORONARY ARTERY DISEASE 05/10/2007  . COPD 05/10/2007    Past Surgical History:  Procedure Laterality Date  . APPENDECTOMY    . BLADDER SURGERY     cancer  . CATARACT EXTRACTION     with lens implants  . COLONOSCOPY  07/21/2006  . NEPHRECTOMY    . TONSILLECTOMY    .  URETHRA SURGERY     stricture  . 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 Authorizing Provider  albuterol (2.5 MG/3ML) 0.083% NEBU 3 mL, albuterol (5 MG/ML) 0.5% NEBU 0.5 mL Inhale 5 mg into the lungs every 6 (six) hours as needed (wheezing/sob).    [provider]  aspirin 81 MG tablet Take 81 mg by mouth daily.    [provider]  busPIRone (BUSPAR) 5 MG tablet Take 5 mg by mouth 2 (two) times daily.     [provider]  Cholecalciferol (VITAMIN D) 2000 UNITS CAPS Take 1 capsule by mouth daily.     [provider]  diltiazem (CARDIZEM CD) 180 MG 24 hr capsule TAKE 1 CAPSULE BY MOUTH DAILY 03/27/11   Plotnikov, Evie Lacks, MD  escitalopram (LEXAPRO) 20 MG tablet Take 20 mg by mouth every morning. 04/30/17   [provider]  guaiFENesin (MUCINEX) 600 MG 12 hr tablet Take 600 mg by mouth daily.    [provider]  LORazepam (ATIVAN) 0.5 MG tablet Take 0.5 mg by mouth 2 (two) times daily.     [provider]  mirtazapine (REMERON) 7.5 MG tablet Take 7.5 mg by mouth at bedtime.  [provider]  Multiple Vitamins-Minerals (PRESERVISION AREDS PO) Take 1 tablet by mouth 2 (two) times daily.    [provider]  Omega-3 Fatty Acids (FISH OIL) 1200 MG CAPS Take 1 capsule by mouth 2 (two) times daily.     [provider]  ondansetron (ZOFRAN-ODT) 4 MG disintegrating tablet Take 4 mg by mouth every 6 (six) hours as needed for nausea or vomiting.    [provider]  sertraline (ZOLOFT) 50 MG tablet Take 50 mg by mouth daily.    [provider]  traZODone (DESYREL) 50 MG tablet TAKE 1 PO TABLET AT BEDTIME 05/23/17   [provider]  ZINC OXIDE, TOPICAL, 10 % CREA Apply topically.    [provider]  budesonide-formoterol (SYMBICORT) 80-4.5 MCG/ACT inhaler Inhale 2 puffs into the lungs  2 (two) times daily.  10/22/11  [provider]  buPROPion (WELLBUTRIN SR) 100 MG 12 hr tablet Take 100 mg by mouth every morning.  10/22/11  [provider]  tiotropium (SPIRIVA) 18 MCG inhalation capsule Place 18 mcg into inhaler and inhale daily.  10/22/11  [provider]    Family History Family History  Problem Relation Age of Onset  . Coronary artery disease Father     Social History Social History   Tobacco Use  . Smoking status: Former Smoker    Types: Cigarettes  . Smokeless tobacco: Never Used  Substance Use Topics  . Alcohol use: No  . Drug use: No     Allergies   Iron; Melatonin; Mirtazapine; and Sulfonamide derivatives   Review of Systems Review of Systems  All other systems reviewed and are negative.    Physical Exam Updated Vital Signs BP (!) 158/78   Pulse 80   Resp 18   SpO2 96%   Physical Exam  Constitutional: He is oriented to person, place, and time. He appears well-developed and well-nourished.  HENT:  Head: Normocephalic and atraumatic.  Eyes: EOM are normal.  Neck: Normal range of motion. Neck supple.  No C-spine tenderness  Cardiovascular: Normal rate and regular rhythm.  Pulmonary/Chest: Effort normal and breath sounds normal.  Abdominal: Soft. There is no tenderness.  Musculoskeletal: Normal range of motion.  Patient with obvious fracture dislocation of the left fourth toe with an open component on the plantar surface.  No active bleeding at this time.  Neurological: He is alert and oriented to person, place, and time.  Skin: Skin is warm and dry.  Psychiatric: He has a normal mood and affect. Judgment normal.  Nursing note and vitals reviewed.    ED Treatments / Results  Labs (all labs ordered are listed, but only abnormal results are displayed) Labs Reviewed  CBC  BASIC METABOLIC PANEL    EKG  EKG Interpretation None       Radiology Dg Toe 4th Left  Result Date: 09/11/2017 CLINICAL DATA:   Unwitnessed tall, fourth toe deformity EXAM: LEFT FOURTH TOE COMPARISON:  12/24/2009 FINDINGS: Diffuse osseous demineralization. Dislocation of the fourth toe PIP joint dorsally and laterally. Tiny fracture fragment noted at joint space. Healing fracture of the distal fifth metatarsal identified with callus. No additional fracture, dislocation, or bone destruction. IMPRESSION: Dorsolateral dislocation fourth PIP joint with a tiny fracture fragment at the joint space. Healing distal fifth metatarsal fracture. Osseous demineralization. Electronically Signed   By: Lavonia Dana M.D.   On: 09/11/2017 09:58    Procedures .Marland KitchenLaceration Repair Performed by: Jola Schmidt, MD Authorized by: Jola Schmidt, MD  LACERATION REPAIR Performed by: Jola Schmidt Consent: Verbal consent obtained. Risks and benefits: risks, benefits and alternatives were discussed Patient identity confirmed: provided demographic data Time out performed prior to procedure Prepped and Draped in normal sterile fashion Wound explored Laceration Location: left 4th toe Laceration Length: 1.5cm No Foreign Bodies seen or palpated Anesthesia:digital block Irrigation method: syringe Amount of cleaning: aggressive irrigation Skin closure: 4-0 nylon Number of sutures or staples: 2 Technique: simple interrupted. Loosely closes Patient tolerance: Patient tolerated the procedure well with no immediate complications.  NERVE BLOCK Performed by: Jola Schmidt Consent: Verbal consent obtained. Required items: required blood products, implants, devices, and special equipment available Time out: Immediately prior to procedure a "time out" was called to verify the correct patient, procedure, equipment, support staff and site/side marked as required. Indication: laceration repair Nerve block body site: digital nerves of left 4th toe Preparation: Patient was prepped and draped in the usual sterile fashion. Needle gauge: 24 G Location  technique: anatomical landmarks Local anesthetic: lidocaine 2% Anesthetic total: 5 ml outcome: pain improved Patient tolerance: Patient tolerated the procedure well with no immediate complications.   Reduction of dislocation Performed by: Jola Schmidt Consent: Verbal consent obtained. Risks and benefits: risks, benefits and alternatives were discussed Consent given by: patient Required items: required blood products, implants, devices, and special equipment available Time out: Immediately prior to procedure a "time out" was called to verify the correct patient, procedure, equipment, support staff and site/side marked as required. Vitals: Vital signs were monitored during sedation. Patient tolerance: Patient tolerated the procedure well with no immediate complications. Joint: left 4th toe IP joint Reduction technique: manipulation     Medications Ordered in ED Medications  ceFAZolin (ANCEF) IVPB 2g/100 mL premix (2 g Intravenous New Bag/Given 09/11/17 1056)     Initial Impression / Assessment and Plan / ED Course  I have reviewed the triage vital signs and the nursing notes.  Pertinent labs & imaging results that were available during my care of the patient were reviewed by me and considered in my medical decision making (see chart for details).    IV antibiotics now.  Open IP joint of the left 4th toe.  Case will be discussed with orthopedic surgery  Spoke with Dr Marlou Sa who will see the patient in close follow up in the office. Agrees with aggressive irrigation at the bedside, abx and close follow up.   Infection warnings given. Injury occurred while wearing socks  Final Clinical Impressions(s) / ED Diagnoses   Final diagnoses:  Open fracture dislocation of toe of left foot, initial encounter    ED Discharge Orders    None       Jola Schmidt, MD 09/11/17 1310

## 2017-09-11 NOTE — ED Notes (Signed)
Patient is agitated. RN Ubaldo Glassing and [employer] aware. Patient states he "wants to leave, he'll take a cab if he has to. If he dies, he dies." Friend at bedside. Suture cart at bedside, per [place]

## 2017-09-11 NOTE — ED Notes (Signed)
Bed: YI50 Expected date:  Expected time:  Means of arrival:  Comments: EMS Laceration

## 2017-09-11 NOTE — ED Notes (Signed)
Waiting on Anicef from pharmacy

## 2017-09-11 NOTE — ED Triage Notes (Signed)
Per EMS, pt from [place] with unwitnessed fall on 1/30, but noticed today that he has a lac to forth toe on left foot. No anticoags. No LOC, denies pain.

## 2017-09-14 ENCOUNTER — Telehealth (INDEPENDENT_AMBULATORY_CARE_PROVIDER_SITE_OTHER): Payer: Self-pay | Admitting: Orthopedic Surgery

## 2017-09-14 NOTE — Telephone Encounter (Signed)
Please call pt Power of attorney to discuss if 2/13 is to late for follow up. I tried to sched pt for earlier appt didn't have availability  unitl 13th. Pt power of attorney was concerned about the length of time pertaining to pt health.  Pt was seen in the ER

## 2017-09-14 NOTE — Telephone Encounter (Signed)
IC no answer. LMVM for him advising Dr Marlou Sa wanted patient seen this afternoon if they could get him in. If not I have scheduled him for Wednesday morning at 830.

## 2017-09-16 ENCOUNTER — Ambulatory Visit (INDEPENDENT_AMBULATORY_CARE_PROVIDER_SITE_OTHER): Payer: Medicare Other | Admitting: Orthopedic Surgery

## 2017-09-17 ENCOUNTER — Encounter (INDEPENDENT_AMBULATORY_CARE_PROVIDER_SITE_OTHER): Payer: Self-pay | Admitting: Orthopedic Surgery

## 2017-09-17 ENCOUNTER — Ambulatory Visit (INDEPENDENT_AMBULATORY_CARE_PROVIDER_SITE_OTHER): Payer: Medicare Other | Admitting: Orthopedic Surgery

## 2017-09-17 DIAGNOSIS — S93105A Unspecified dislocation of left toe(s), initial encounter: Secondary | ICD-10-CM

## 2017-09-19 ENCOUNTER — Encounter (INDEPENDENT_AMBULATORY_CARE_PROVIDER_SITE_OTHER): Payer: Self-pay | Admitting: Orthopedic Surgery

## 2017-09-19 NOTE — Progress Notes (Signed)
Office Visit Note   Patient: [name].           Date of Birth: 06/24/1925           MRN: [id number] Visit Date: 09/17/2017 Requested by: Lajean Manes, MD [address] [place], [place] [address] PCP: Lajean Manes, MD  Subjective: Chief Complaint  Patient presents with  . Left Foot - Follow-up, Fracture    HPI: [name] is a patient who sustained fourth toe a week ago.  He has been treated at the emergency room with irrigation and reduction of the toe.  No symptoms currently.  He reports minimal pain.              ROS: All systems reviewed are negative as they relate to the chief complaint within the history of present illness.  Patient denies  fevers or chills.   Assessment & Plan: Visit Diagnoses:  1. Toe dislocation, left, initial encounter     Plan: Impression is left toe open dislocation which is currently asymptomatic.  Incision looks intact.  The plan is to continue with the buddy taping and come back in 8 days for clinical recheck.  If toe becomes infected we would likely have to amputate it.  Follow-Up Instructions: Return in about 8 days (around 09/25/2017).   Orders:  No orders of the defined types were placed in this encounter.  No orders of the defined types were placed in this encounter.     Procedures: No procedures performed   Clinical Data: No additional findings.  Objective: Vital Signs: There were no vitals taken for this visit.  Physical Exam: All systems reviewed are negative as they relate to the chief complaint within the history of present illness.  Patient denies  fevers or chills.   Constitutional: Patient appears well-developed HEENT:  Head: Normocephalic Eyes:EOM are normal Neck: Normal range of motion Cardiovascular: Normal rate Pulmonary/chest: Effort normal Neurologic: Patient is alert Skin: Skin is warm Psychiatric: Patient has normal mood and affect    Ortho Exam: Orthopedic exam demonstrates  palpable pedal pulses in the left foot.  The toe itself has predictable swelling but there is no evidence of infection.  There is no drainage around the incision.  Toe itself is perfused and sensate  Specialty Comments:  No specialty comments available.  Imaging: No results found.   PMFS History: Patient Active Problem List   Diagnosis Date Noted  . Adjustment disorder with depressed mood 09/04/2017  . NORMAL PRESSURE HYDROCEPHALUS 04/12/2010  . DEPRESSION 12/15/2007  . DIABETES MELLITUS, TYPE II 05/10/2007  . HYPERLIPIDEMIA 05/10/2007  . HYPERTENSION 05/10/2007  . CORONARY ARTERY DISEASE 05/10/2007  . COPD 05/10/2007   Past Medical History:  Diagnosis Date  . Adenomatous polyps 2003, 2006, 2007  . Bladder cancer ([place])   . CAD (coronary artery disease)   . COPD (chronic obstructive pulmonary disease) ([place])   . Depression   . Diverticulosis 07/2006  . DM (diabetes mellitus) ([place])   . Hemorrhoids, external   . Hemorrhoids, internal   . History of prostate cancer   . HLD (hyperlipidemia)   . HTN (hypertension)   . Iron deficiency anemia   . Macular degeneration   . Normal pressure hydrocephalus   . 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 Coronary artery disease Father     Past Surgical History:  Procedure Laterality Date  . APPENDECTOMY    . BLADDER SURGERY  cancer  . CATARACT EXTRACTION     with lens implants  . COLONOSCOPY  07/21/2006  . NEPHRECTOMY    . TONSILLECTOMY    . URETHRA SURGERY     stricture  . 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 Occupation: Retired    Fish farm manager: RETIRED  Tobacco Use  . Smoking status: Former Smoker    Types: Cigarettes  . Smokeless tobacco: Never Used  Substance and Sexual Activity  . Alcohol use: No  . Drug use: No  . Sexual activity: Not on file

## 2017-09-23 ENCOUNTER — Ambulatory Visit (INDEPENDENT_AMBULATORY_CARE_PROVIDER_SITE_OTHER): Payer: Medicare Other | Admitting: Orthopedic Surgery

## 2017-09-24 ENCOUNTER — Encounter (INDEPENDENT_AMBULATORY_CARE_PROVIDER_SITE_OTHER): Payer: Self-pay | Admitting: Orthopedic Surgery

## 2017-09-24 ENCOUNTER — Ambulatory Visit (INDEPENDENT_AMBULATORY_CARE_PROVIDER_SITE_OTHER): Payer: Medicare Other | Admitting: Orthopedic Surgery

## 2017-09-24 DIAGNOSIS — S93105A Unspecified dislocation of left toe(s), initial encounter: Secondary | ICD-10-CM | POA: Diagnosis not present

## 2017-09-26 ENCOUNTER — Encounter (INDEPENDENT_AMBULATORY_CARE_PROVIDER_SITE_OTHER): Payer: Self-pay | Admitting: Orthopedic Surgery

## 2017-09-26 NOTE — Progress Notes (Signed)
Office Visit Note   Patient: [name].           Date of Birth: 05-Apr-1925           MRN: [id number] Visit Date: 09/24/2017 Requested by: Lajean Manes, MD [address] [place], [place] [address] PCP: Lajean Manes, MD  Subjective: Chief Complaint  Patient presents with  . Left Foot - Follow-up    HPI: [name] presents for follow-up of left fracture.  Toes remain dressed.  There is been no problem or pain with the left fourth toe.  Sutures remain in place.              ROS: All systems reviewed are negative as they relate to the chief complaint within the history of present illness.  Patient denies  fevers or chills.   Assessment & Plan: Visit Diagnoses:  1. Toe dislocation, left, initial encounter     Plan: Impression is left fourth toe open dislocation.  No evidence of infection at this time.  He does not have much pain due to neuropathy affecting the feet.  Sutures DC'd today.  Him back in about 4 weeks for clinical recheck.  Toe still at risk of infection and amputation.  No evidence of ongoing infection at this time.  Follow-Up Instructions: No Follow-up on file.   Orders:  No orders of the defined types were placed in this encounter.  No orders of the defined types were placed in this encounter.     Procedures: No procedures performed   Clinical Data: No additional findings.  Objective: Vital Signs: There were no vitals taken for this visit.  Physical Exam:   Constitutional: Patient appears well-developed HEENT:  Head: Normocephalic Eyes:EOM are normal Neck: Normal range of motion Cardiovascular: Normal rate Pulmonary/chest: Effort normal Neurologic: Patient is alert Skin: Skin is warm Psychiatric: Patient has normal mood and affect    Ortho Exam: Orthopedic exam demonstrates palpable pedal pulses.  The left fourth toe incision line is intact.  Sutures removed today.  No real pain to palpation of the toe.  Still the  expected amount of swelling present.  Toe itself is perfused.  Specialty Comments:  No specialty comments available.  Imaging: No results found.   PMFS History: Patient Active Problem List   Diagnosis Date Noted  . Adjustment disorder with depressed mood 09/04/2017  . NORMAL PRESSURE HYDROCEPHALUS 04/12/2010  . DEPRESSION 12/15/2007  . DIABETES MELLITUS, TYPE II 05/10/2007  . HYPERLIPIDEMIA 05/10/2007  . HYPERTENSION 05/10/2007  . CORONARY ARTERY DISEASE 05/10/2007  . COPD 05/10/2007   Past Medical History:  Diagnosis Date  . Adenomatous polyps 2003, 2006, 2007  . Bladder cancer ([place])   . CAD (coronary artery disease)   . COPD (chronic obstructive pulmonary disease) ([place])   . Depression   . Diverticulosis 07/2006  . DM (diabetes mellitus) ([place])   . Hemorrhoids, external   . Hemorrhoids, internal   . History of prostate cancer   . HLD (hyperlipidemia)   . HTN (hypertension)   . Iron deficiency anemia   . Macular degeneration   . Normal pressure hydrocephalus   . 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 Coronary artery disease Father     Past Surgical History:  Procedure Laterality Date  . APPENDECTOMY    . BLADDER SURGERY     cancer  . CATARACT EXTRACTION     with lens implants  . COLONOSCOPY  07/21/2006  . NEPHRECTOMY    . TONSILLECTOMY    . URETHRA SURGERY     stricture  . 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 Occupation: Retired    Fish farm manager: RETIRED  Tobacco Use  . Smoking status: Former Smoker    Types: Cigarettes  . Smokeless tobacco: Never Used  Substance and Sexual Activity  . Alcohol use: No  . Drug use: No  . Sexual activity: Not on file

## 2017-10-13 ENCOUNTER — Ambulatory Visit: Payer: Medicare Other | Admitting: Podiatry

## 2017-10-19 ENCOUNTER — Inpatient Hospital Stay (HOSPITAL_COMMUNITY)
Admission: EM | Admit: 2017-10-19 | Discharge: 2017-11-09 | DRG: 480 | Disposition: E | Payer: Medicare Other | Attending: Internal Medicine | Admitting: Internal Medicine

## 2017-10-19 ENCOUNTER — Emergency Department (HOSPITAL_COMMUNITY): Payer: Medicare Other

## 2017-10-19 ENCOUNTER — Encounter (HOSPITAL_COMMUNITY): Payer: Self-pay

## 2017-10-19 DIAGNOSIS — Y92129 Unspecified place in nursing home as the place of occurrence of the external cause: Secondary | ICD-10-CM

## 2017-10-19 DIAGNOSIS — Z8546 Personal history of malignant neoplasm of prostate: Secondary | ICD-10-CM

## 2017-10-19 DIAGNOSIS — Z7951 Long term (current) use of inhaled steroids: Secondary | ICD-10-CM

## 2017-10-19 DIAGNOSIS — I110 Hypertensive heart disease with heart failure: Secondary | ICD-10-CM | POA: Diagnosis present

## 2017-10-19 DIAGNOSIS — Z87891 Personal history of nicotine dependence: Secondary | ICD-10-CM

## 2017-10-19 DIAGNOSIS — N179 Acute kidney failure, unspecified: Secondary | ICD-10-CM | POA: Diagnosis not present

## 2017-10-19 DIAGNOSIS — R1310 Dysphagia, unspecified: Secondary | ICD-10-CM | POA: Diagnosis not present

## 2017-10-19 DIAGNOSIS — Z982 Presence of cerebrospinal fluid drainage device: Secondary | ICD-10-CM

## 2017-10-19 DIAGNOSIS — H3530 Unspecified macular degeneration: Secondary | ICD-10-CM | POA: Diagnosis present

## 2017-10-19 DIAGNOSIS — J440 Chronic obstructive pulmonary disease with acute lower respiratory infection: Secondary | ICD-10-CM | POA: Diagnosis present

## 2017-10-19 DIAGNOSIS — E870 Hyperosmolality and hypernatremia: Secondary | ICD-10-CM | POA: Diagnosis not present

## 2017-10-19 DIAGNOSIS — Z8781 Personal history of (healed) traumatic fracture: Secondary | ICD-10-CM

## 2017-10-19 DIAGNOSIS — Z8551 Personal history of malignant neoplasm of bladder: Secondary | ICD-10-CM

## 2017-10-19 DIAGNOSIS — I2510 Atherosclerotic heart disease of native coronary artery without angina pectoris: Secondary | ICD-10-CM | POA: Diagnosis present

## 2017-10-19 DIAGNOSIS — Z7982 Long term (current) use of aspirin: Secondary | ICD-10-CM

## 2017-10-19 DIAGNOSIS — E876 Hypokalemia: Secondary | ICD-10-CM | POA: Diagnosis not present

## 2017-10-19 DIAGNOSIS — F32A Depression, unspecified: Secondary | ICD-10-CM

## 2017-10-19 DIAGNOSIS — Z905 Acquired absence of kidney: Secondary | ICD-10-CM

## 2017-10-19 DIAGNOSIS — E119 Type 2 diabetes mellitus without complications: Secondary | ICD-10-CM | POA: Diagnosis present

## 2017-10-19 DIAGNOSIS — R0902 Hypoxemia: Secondary | ICD-10-CM

## 2017-10-19 DIAGNOSIS — G9341 Metabolic encephalopathy: Secondary | ICD-10-CM | POA: Diagnosis not present

## 2017-10-19 DIAGNOSIS — K5909 Other constipation: Secondary | ICD-10-CM | POA: Diagnosis not present

## 2017-10-19 DIAGNOSIS — D62 Acute posthemorrhagic anemia: Secondary | ICD-10-CM | POA: Diagnosis not present

## 2017-10-19 DIAGNOSIS — S72002A Fracture of unspecified part of neck of left femur, initial encounter for closed fracture: Secondary | ICD-10-CM

## 2017-10-19 DIAGNOSIS — W1830XA Fall on same level, unspecified, initial encounter: Secondary | ICD-10-CM | POA: Diagnosis present

## 2017-10-19 DIAGNOSIS — R0682 Tachypnea, not elsewhere classified: Secondary | ICD-10-CM

## 2017-10-19 DIAGNOSIS — I5022 Chronic systolic (congestive) heart failure: Secondary | ICD-10-CM | POA: Diagnosis present

## 2017-10-19 DIAGNOSIS — Z79899 Other long term (current) drug therapy: Secondary | ICD-10-CM

## 2017-10-19 DIAGNOSIS — M25552 Pain in left hip: Secondary | ICD-10-CM | POA: Diagnosis not present

## 2017-10-19 DIAGNOSIS — J9602 Acute respiratory failure with hypercapnia: Secondary | ICD-10-CM | POA: Diagnosis not present

## 2017-10-19 DIAGNOSIS — Z8249 Family history of ischemic heart disease and other diseases of the circulatory system: Secondary | ICD-10-CM

## 2017-10-19 DIAGNOSIS — F329 Major depressive disorder, single episode, unspecified: Secondary | ICD-10-CM | POA: Diagnosis present

## 2017-10-19 DIAGNOSIS — E785 Hyperlipidemia, unspecified: Secondary | ICD-10-CM | POA: Diagnosis present

## 2017-10-19 DIAGNOSIS — F0390 Unspecified dementia without behavioral disturbance: Secondary | ICD-10-CM | POA: Diagnosis present

## 2017-10-19 DIAGNOSIS — J189 Pneumonia, unspecified organism: Secondary | ICD-10-CM | POA: Diagnosis present

## 2017-10-19 DIAGNOSIS — F419 Anxiety disorder, unspecified: Secondary | ICD-10-CM | POA: Diagnosis present

## 2017-10-19 DIAGNOSIS — S72009A Fracture of unspecified part of neck of unspecified femur, initial encounter for closed fracture: Secondary | ICD-10-CM | POA: Diagnosis present

## 2017-10-19 DIAGNOSIS — S72142A Displaced intertrochanteric fracture of left femur, initial encounter for closed fracture: Secondary | ICD-10-CM | POA: Diagnosis not present

## 2017-10-19 DIAGNOSIS — Z66 Do not resuscitate: Secondary | ICD-10-CM | POA: Diagnosis present

## 2017-10-19 DIAGNOSIS — E872 Acidosis: Secondary | ICD-10-CM | POA: Diagnosis not present

## 2017-10-19 DIAGNOSIS — Y95 Nosocomial condition: Secondary | ICD-10-CM | POA: Diagnosis present

## 2017-10-19 DIAGNOSIS — J9601 Acute respiratory failure with hypoxia: Secondary | ICD-10-CM | POA: Diagnosis not present

## 2017-10-19 MED ORDER — SODIUM CHLORIDE 0.9 % IV SOLN
INTRAVENOUS | Status: DC
  Administered 2017-10-20: 01:00:00 via INTRAVENOUS

## 2017-10-19 MED ORDER — MORPHINE SULFATE (PF) 4 MG/ML IV SOLN
4.0000 mg | INTRAVENOUS | Status: AC | PRN
  Administered 2017-10-20 (×3): 4 mg via INTRAVENOUS
  Filled 2017-10-19 (×2): qty 1

## 2017-10-19 NOTE — ED Notes (Signed)
Left leg pain after fall from standing pain increases with palpation negative shortening or external rotatiton note BP150/90 Hr 76 NSR refused pain medication. Skin tear medial right wrist

## 2017-10-19 NOTE — ED Provider Notes (Signed)
[facility] DEPT Provider Note   CSN: [id number] Arrival date & time: 11/08/2017  2237     History   Chief Complaint Chief Complaint  Patient presents with  . Fall  . Leg Pain    HPI Jose H Terez Freimark. is a 84 y.o. male.  HPI Patient presents to the emergency room for evaluation of an unwitnessed fall.  Patient is a resident of a nursing facility.  He apparently fell at the nursing facility and was found on the ground.  Patient is unable to tell me what happened.  He denies any injuries.  He denies any loss of consciousness but he cannot specifically tell me the details.  Patient initially was complaining of left thigh and leg pain.  EMS was called and was brought to the emergency room. Past Medical History:  Diagnosis Date  . Adenomatous polyps 2003, 2006, 2007  . Bladder cancer ([place])   . CAD (coronary artery disease)   . COPD (chronic obstructive pulmonary disease) ([place])   . Depression   . Diverticulosis 07/2006  . DM (diabetes mellitus) ([place])   . Hemorrhoids, external   . Hemorrhoids, internal   . History of prostate cancer   . HLD (hyperlipidemia)   . HTN (hypertension)   . Iron deficiency anemia   . Macular degeneration   . Normal pressure hydrocephalus   . Ureteral cancer [facility])     Patient Active Problem List   Diagnosis Date Noted  . Adjustment disorder with depressed mood 09/04/2017  . NORMAL PRESSURE HYDROCEPHALUS 04/12/2010  . DEPRESSION 12/15/2007  . DIABETES MELLITUS, TYPE II 05/10/2007  . HYPERLIPIDEMIA 05/10/2007  . HYPERTENSION 05/10/2007  . CORONARY ARTERY DISEASE 05/10/2007  . COPD 05/10/2007    Past Surgical History:  Procedure Laterality Date  . APPENDECTOMY    . BLADDER SURGERY     cancer  . CATARACT EXTRACTION     with lens implants  . COLONOSCOPY  07/21/2006  . NEPHRECTOMY    . TONSILLECTOMY    . URETHRA SURGERY     stricture  . 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 Authorizing Provider  albuterol (2.5 MG/3ML) 0.083% NEBU 3 mL, albuterol (5 MG/ML) 0.5% NEBU 0.5 mL Inhale 5 mg into the lungs every 6 (six) hours as needed (wheezing/sob).    [provider]  aspirin 81 MG tablet Take 81 mg by mouth daily.    [provider]  busPIRone (BUSPAR) 5 MG tablet Take 5 mg by mouth 2 (two) times daily.     [provider]  cephALEXin (KEFLEX) 500 MG capsule Take 1 capsule (500 mg total) by mouth 3 (three) times daily. 09/11/17   Jola Schmidt, MD  Cholecalciferol (VITAMIN D) 2000 UNITS CAPS Take 1 capsule by mouth daily.     [provider]  diltiazem (CARDIZEM CD) 180 MG 24 hr capsule TAKE 1 CAPSULE BY MOUTH DAILY 03/27/11   Plotnikov, Evie Lacks, MD  escitalopram (LEXAPRO) 20 MG tablet Take 20 mg by mouth every morning. 04/30/17   [provider]  guaiFENesin (MUCINEX) 600 MG 12 hr tablet Take 600 mg by mouth daily.    [provider]  LORazepam (ATIVAN) 0.5 MG tablet Take 0.5 mg by mouth 2 (two) times daily.     [provider]  mirtazapine (REMERON) 7.5 MG tablet Take 7.5 mg by mouth at  bedtime.    [provider]  Multiple Vitamins-Minerals (PRESERVISION AREDS PO) Take 1 tablet by mouth 2 (two) times daily.    [provider]  Omega-3 Fatty Acids (FISH OIL) 1200 MG CAPS Take 1 capsule by mouth 2 (two) times daily.     [provider]  ondansetron (ZOFRAN-ODT) 4 MG disintegrating tablet Take 4 mg by mouth every 6 (six) hours as needed for nausea or vomiting.    [provider]  sertraline (ZOLOFT) 50 MG tablet Take 50 mg by mouth daily.    [provider]  traZODone (DESYREL) 50 MG tablet TAKE 1 PO TABLET AT BEDTIME 05/23/17   [provider]  ZINC OXIDE, TOPICAL, 10 % CREA Apply topically.    [provider]  budesonide-formoterol (SYMBICORT) 80-4.5 MCG/ACT inhaler  Inhale 2 puffs into the lungs 2 (two) times daily.  10/22/11  [provider]  buPROPion (WELLBUTRIN SR) 100 MG 12 hr tablet Take 100 mg by mouth every morning.  10/22/11  [provider]  tiotropium (SPIRIVA) 18 MCG inhalation capsule Place 18 mcg into inhaler and inhale daily.  10/22/11  [provider]    Family History Family History  Problem Relation Age of Onset  . Coronary artery disease Father     Social History Social History   Tobacco Use  . Smoking status: Former Smoker    Types: Cigarettes  . Smokeless tobacco: Never Used  Substance Use Topics  . Alcohol use: No  . Drug use: No     Allergies   Iron; Melatonin; Mirtazapine; and Sulfonamide derivatives   Review of Systems Review of Systems  All other systems reviewed and are negative.    Physical Exam Updated Vital Signs BP (!) 144/91 (BP Location: Right Arm)   Pulse 92   Temp 98 F (36.7 C) (Oral)   Resp (!) 26   SpO2 92%   Physical Exam  Constitutional: No distress.  HENT:  Head: Normocephalic and atraumatic.  Right Ear: External ear normal.  Left Ear: External ear normal.  Eyes: Conjunctivae are normal. Right eye exhibits no discharge. Left eye exhibits no discharge. No scleral icterus.  Neck: Neck supple. No tracheal deviation present.  Cardiovascular: Normal rate, regular rhythm and intact distal pulses.  Pulmonary/Chest: Effort normal and breath sounds normal. No stridor. No respiratory distress. He has no wheezes. He has no rales.  Abdominal: Soft. Bowel sounds are normal. He exhibits no distension. There is no tenderness. There is no rebound and no guarding.  Musculoskeletal: He exhibits no edema.       Right shoulder: He exhibits no tenderness, no bony tenderness and no swelling.       Left shoulder: He exhibits no tenderness, no bony tenderness and no swelling.       Right wrist: He exhibits no tenderness, no bony tenderness and no swelling.       Left wrist: He  exhibits no tenderness, no bony tenderness and no swelling.       Right hip: He exhibits normal range of motion, no tenderness, no bony tenderness and no swelling.       Left hip: He exhibits decreased range of motion and tenderness. He exhibits no bony tenderness, no swelling and no deformity.       Right ankle: He exhibits no swelling. No tenderness.       Left ankle: He exhibits no swelling. No tenderness.       Cervical back: He exhibits no tenderness, no bony  tenderness and no swelling.       Thoracic back: He exhibits no tenderness, no bony tenderness and no swelling.       Lumbar back: He exhibits no tenderness, no bony tenderness and no swelling.       Left upper leg: He exhibits tenderness. He exhibits no swelling, no edema and no deformity.  Neurological: He is alert. He has normal strength. No cranial nerve deficit (no facial droop, extraocular movements intact, no slurred speech) or sensory deficit. He exhibits normal muscle tone. He displays no seizure activity. Coordination normal.  Skin: Skin is warm and dry. No rash noted. He is not diaphoretic.  Psychiatric: He has a normal mood and affect.  Nursing note and vitals reviewed.    ED Treatments / Results  Labs (all labs ordered are listed, but only abnormal results are displayed) Labs Reviewed  BASIC METABOLIC PANEL  CBC WITH DIFFERENTIAL/PLATELET  PROTIME-INR  TYPE AND SCREEN    EKG  EKG Interpretation  Date/Time:  Tuesday October 20 2017 00:04:48 EDT Ventricular Rate:  93 PR Interval:    QRS Duration: 129 QT Interval:  382 QTC Calculation: 476 R Axis:   -69 Text Interpretation:  Sinus rhythm Right bundle branch block No significant change since last tracing Confirmed by Dorie Rank [phone]) on 10/26/2017 12:10:10 AM       Radiology Ct Head Wo Contrast  Result Date: 10/22/2017 CLINICAL DATA:  Left leg pain after fall from standing. Head trauma. EXAM: CT HEAD WITHOUT CONTRAST TECHNIQUE: Contiguous axial images  were obtained from the base of the skull through the vertex without intravenous contrast. COMPARISON:  05/29/2011 FINDINGS: Brain: Chronic stable ventriculomegaly with shunt place from a right frontal approach. Patchy small vessel ischemic disease. No acute intracranial hemorrhage, midline shift or edema. No large vascular territory infarct. No intra-axial mass nor extra-axial fluid collections. Vascular: No hyperdense vessel sign. Moderate atherosclerosis of carotid siphons. Skull: No fracture nor bone destruction. Sinuses/Orbits: Bilateral lens replacements. Clear mastoids. Minimal mucosal thickening along the posterior left maxillary sinus mild ethmoid sinus mucosal thickening noted as well. Other: None IMPRESSION: Ventriculomegaly with shunt in place. No acute intracranial abnormality. Chronic small vessel ischemic disease. Electronically Signed   By: Ashley Royalty M.D.   On: 10/16/2017 23:30   Dg Hip Unilat With Pelvis 2-3 Views Left  Result Date: 10/13/2017 CLINICAL DATA:  Unwitnessed fall.  Hip pain. EXAM: DG HIP (WITH OR WITHOUT PELVIS) 2-3V LEFT COMPARISON:  None. FINDINGS: Acute, comminuted varus angulated intertrochanteric fracture of the left femur with avulsed lesser trochanter. No femoral head dislocation. Surgical clips project over the left hemipelvis. No pelvic fracture. Lower lumbar degenerative disc and facet arthropathy from L4 caudad. IMPRESSION: Acute, comminuted varus angulated intertrochanteric fracture of the left femur with avulsed lesser trochanter. Electronically Signed   By: Ashley Royalty M.D.   On: 10/13/2017 23:32   Dg Femur 1v Left  Result Date: 10/20/2017 CLINICAL DATA:  Fracture of the left hip EXAM: LEFT FEMUR 1 VIEW COMPARISON:  Left hip radiographs acquired sequentially. FINDINGS: AP view of the mid to distal left femur was provided in conjunction with previous same day hip radiographs. The patient has a known left intertrochanteric fracture of the left femur with avulsed  lesser trochanter. The visualized femoral shaft and condyles appear intact on this single frontal view of the mid to distal femur. IMPRESSION: No fracture of the mid to distal femur nor femoral condyles. Please see the left hip radiograph report for fracture of the  left intertrochanter with avulsed lesser trochanter. Electronically Signed   By: Ashley Royalty M.D.   On: 10/18/2017 23:34    Procedures Procedures (including critical care time)  Medications Ordered in ED Medications  0.9 %  sodium chloride infusion (not administered)  morphine 4 MG/ML injection 4 mg (not administered)     Initial Impression / Assessment and Plan / ED Course  I have reviewed the triage vital signs and the nursing notes.  Pertinent labs & imaging results that were available during my care of the patient were reviewed by me and considered in my medical decision making (see chart for details).  Clinical Course as of Oct 20 13  Tue Oct 20, 2017  0004 Discussed findings with patient.  He is refusing surgery right now.  I am not sure that the patient completely understands the consequences.  It may be helpful to have orthopedics discussed the options with him in the morning and possibly have family come in as well.  [JK]  0011 D/w Dr Alvan Dame.  Will see patient in the am  [JK]    Clinical Course User Index [JK] Dorie Rank, MD    Pt presents to the ED for evaluation of hip pain after a fall.  Xrays show a hip fracture.  Labs are pending.  I discussed with orthopedics, Dr Alvan Dame who will see patient in the AM to discuss options.  Pt will need to be admitted to the hospital for further treatment. Dr Roxanne Mins will follow up on the labs that are pending  Final Clinical Impressions(s) / ED Diagnoses   Final diagnoses:  Closed fracture of left hip, initial encounter [facility])      Dorie Rank, MD 11/07/2017 [phone]

## 2017-10-19 NOTE — ED Notes (Signed)
Pt had an unwitnessed fall, he complains of left leg and left thigh pain Pt didn't want to coe to the hospital

## 2017-10-20 ENCOUNTER — Inpatient Hospital Stay (HOSPITAL_COMMUNITY): Payer: Medicare Other | Admitting: Certified Registered Nurse Anesthetist

## 2017-10-20 ENCOUNTER — Encounter (HOSPITAL_COMMUNITY): Admission: EM | Disposition: E | Payer: Self-pay | Source: Home / Self Care | Attending: Internal Medicine

## 2017-10-20 ENCOUNTER — Other Ambulatory Visit: Payer: Self-pay

## 2017-10-20 ENCOUNTER — Encounter (HOSPITAL_COMMUNITY): Payer: Self-pay | Admitting: Anesthesiology

## 2017-10-20 ENCOUNTER — Emergency Department (HOSPITAL_COMMUNITY): Payer: Medicare Other

## 2017-10-20 DIAGNOSIS — E870 Hyperosmolality and hypernatremia: Secondary | ICD-10-CM | POA: Diagnosis not present

## 2017-10-20 DIAGNOSIS — Y95 Nosocomial condition: Secondary | ICD-10-CM | POA: Diagnosis present

## 2017-10-20 DIAGNOSIS — S72002A Fracture of unspecified part of neck of left femur, initial encounter for closed fracture: Secondary | ICD-10-CM | POA: Diagnosis not present

## 2017-10-20 DIAGNOSIS — F0280 Dementia in other diseases classified elsewhere without behavioral disturbance: Secondary | ICD-10-CM

## 2017-10-20 DIAGNOSIS — R0682 Tachypnea, not elsewhere classified: Secondary | ICD-10-CM | POA: Diagnosis not present

## 2017-10-20 DIAGNOSIS — S72142A Displaced intertrochanteric fracture of left femur, initial encounter for closed fracture: Secondary | ICD-10-CM | POA: Diagnosis present

## 2017-10-20 DIAGNOSIS — Z905 Acquired absence of kidney: Secondary | ICD-10-CM | POA: Diagnosis not present

## 2017-10-20 DIAGNOSIS — J9601 Acute respiratory failure with hypoxia: Secondary | ICD-10-CM | POA: Diagnosis not present

## 2017-10-20 DIAGNOSIS — Z8551 Personal history of malignant neoplasm of bladder: Secondary | ICD-10-CM | POA: Diagnosis not present

## 2017-10-20 DIAGNOSIS — R0902 Hypoxemia: Secondary | ICD-10-CM | POA: Diagnosis not present

## 2017-10-20 DIAGNOSIS — S72002D Fracture of unspecified part of neck of left femur, subsequent encounter for closed fracture with routine healing: Secondary | ICD-10-CM | POA: Diagnosis not present

## 2017-10-20 DIAGNOSIS — Z8781 Personal history of (healed) traumatic fracture: Secondary | ICD-10-CM

## 2017-10-20 DIAGNOSIS — N179 Acute kidney failure, unspecified: Secondary | ICD-10-CM | POA: Diagnosis not present

## 2017-10-20 DIAGNOSIS — R262 Difficulty in walking, not elsewhere classified: Secondary | ICD-10-CM | POA: Diagnosis not present

## 2017-10-20 DIAGNOSIS — Z79899 Other long term (current) drug therapy: Secondary | ICD-10-CM | POA: Diagnosis not present

## 2017-10-20 DIAGNOSIS — Z87891 Personal history of nicotine dependence: Secondary | ICD-10-CM | POA: Diagnosis not present

## 2017-10-20 DIAGNOSIS — F329 Major depressive disorder, single episode, unspecified: Secondary | ICD-10-CM | POA: Diagnosis not present

## 2017-10-20 DIAGNOSIS — S72009A Fracture of unspecified part of neck of unspecified femur, initial encounter for closed fracture: Secondary | ICD-10-CM | POA: Diagnosis present

## 2017-10-20 DIAGNOSIS — Y92129 Unspecified place in nursing home as the place of occurrence of the external cause: Secondary | ICD-10-CM | POA: Diagnosis not present

## 2017-10-20 DIAGNOSIS — E785 Hyperlipidemia, unspecified: Secondary | ICD-10-CM | POA: Diagnosis present

## 2017-10-20 DIAGNOSIS — E119 Type 2 diabetes mellitus without complications: Secondary | ICD-10-CM | POA: Diagnosis present

## 2017-10-20 DIAGNOSIS — Z8249 Family history of ischemic heart disease and other diseases of the circulatory system: Secondary | ICD-10-CM | POA: Diagnosis not present

## 2017-10-20 DIAGNOSIS — G20 Parkinson's disease: Secondary | ICD-10-CM | POA: Diagnosis not present

## 2017-10-20 DIAGNOSIS — W1830XA Fall on same level, unspecified, initial encounter: Secondary | ICD-10-CM | POA: Diagnosis present

## 2017-10-20 DIAGNOSIS — R0603 Acute respiratory distress: Secondary | ICD-10-CM | POA: Diagnosis not present

## 2017-10-20 DIAGNOSIS — G9341 Metabolic encephalopathy: Secondary | ICD-10-CM | POA: Diagnosis not present

## 2017-10-20 DIAGNOSIS — J069 Acute upper respiratory infection, unspecified: Secondary | ICD-10-CM | POA: Diagnosis not present

## 2017-10-20 DIAGNOSIS — E872 Acidosis: Secondary | ICD-10-CM | POA: Diagnosis not present

## 2017-10-20 DIAGNOSIS — J9602 Acute respiratory failure with hypercapnia: Secondary | ICD-10-CM | POA: Diagnosis not present

## 2017-10-20 DIAGNOSIS — J440 Chronic obstructive pulmonary disease with acute lower respiratory infection: Secondary | ICD-10-CM | POA: Diagnosis present

## 2017-10-20 DIAGNOSIS — I5022 Chronic systolic (congestive) heart failure: Secondary | ICD-10-CM | POA: Diagnosis present

## 2017-10-20 DIAGNOSIS — Z8546 Personal history of malignant neoplasm of prostate: Secondary | ICD-10-CM | POA: Diagnosis not present

## 2017-10-20 DIAGNOSIS — M25552 Pain in left hip: Secondary | ICD-10-CM | POA: Diagnosis present

## 2017-10-20 DIAGNOSIS — Z7982 Long term (current) use of aspirin: Secondary | ICD-10-CM | POA: Diagnosis not present

## 2017-10-20 DIAGNOSIS — H3530 Unspecified macular degeneration: Secondary | ICD-10-CM | POA: Diagnosis present

## 2017-10-20 DIAGNOSIS — Z7951 Long term (current) use of inhaled steroids: Secondary | ICD-10-CM | POA: Diagnosis not present

## 2017-10-20 DIAGNOSIS — I2510 Atherosclerotic heart disease of native coronary artery without angina pectoris: Secondary | ICD-10-CM | POA: Diagnosis present

## 2017-10-20 DIAGNOSIS — J189 Pneumonia, unspecified organism: Secondary | ICD-10-CM | POA: Diagnosis present

## 2017-10-20 DIAGNOSIS — D62 Acute posthemorrhagic anemia: Secondary | ICD-10-CM | POA: Diagnosis not present

## 2017-10-20 HISTORY — PX: INTRAMEDULLARY (IM) NAIL INTERTROCHANTERIC: SHX5875

## 2017-10-20 LAB — CBC WITH DIFFERENTIAL/PLATELET
Basophils Absolute: 0 K/uL (ref 0.0–0.1)
Basophils Relative: 0 %
Eosinophils Absolute: 0.1 K/uL (ref 0.0–0.7)
Eosinophils Relative: 1 %
HCT: 36.5 % — ABNORMAL LOW (ref 39.0–52.0)
Hemoglobin: 12 g/dL — ABNORMAL LOW (ref 13.0–17.0)
Lymphocytes Relative: 14 %
Lymphs Abs: 1.6 K/uL (ref 0.7–4.0)
MCH: 30.3 pg (ref 26.0–34.0)
MCHC: 32.9 g/dL (ref 30.0–36.0)
MCV: 92.2 fL (ref 78.0–100.0)
Monocytes Absolute: 1 K/uL (ref 0.1–1.0)
Monocytes Relative: 9 %
Neutro Abs: 8.7 K/uL — ABNORMAL HIGH (ref 1.7–7.7)
Neutrophils Relative %: 76 %
Platelets: 284 K/uL (ref 150–400)
RBC: 3.96 MIL/uL — ABNORMAL LOW (ref 4.22–5.81)
RDW: 14.8 % (ref 11.5–15.5)
WBC: 11.5 K/uL — ABNORMAL HIGH (ref 4.0–10.5)

## 2017-10-20 LAB — SURGICAL PCR SCREEN
MRSA, PCR: NEGATIVE
STAPHYLOCOCCUS AUREUS: NEGATIVE

## 2017-10-20 LAB — BASIC METABOLIC PANEL WITH GFR
Anion gap: 9 (ref 5–15)
BUN: 21 mg/dL — ABNORMAL HIGH (ref 6–20)
CO2: 25 mmol/L (ref 22–32)
Calcium: 8.4 mg/dL — ABNORMAL LOW (ref 8.9–10.3)
Chloride: 103 mmol/L (ref 101–111)
Creatinine, Ser: 1.09 mg/dL (ref 0.61–1.24)
GFR calc Af Amer: >60 mL/min
GFR calc non Af Amer: 58 mL/min — ABNORMAL LOW
Glucose, Bld: 126 mg/dL — ABNORMAL HIGH (ref 65–99)
Potassium: 4.1 mmol/L (ref 3.5–5.1)
Sodium: 137 mmol/L (ref 135–145)

## 2017-10-20 LAB — PROTIME-INR
INR: 1.01
Prothrombin Time: 13.2 seconds (ref 11.4–15.2)

## 2017-10-20 LAB — ABO/RH: ABO/RH(D): O POS

## 2017-10-20 SURGERY — FIXATION, FRACTURE, INTERTROCHANTERIC, WITH INTRAMEDULLARY ROD
Anesthesia: General | Site: Leg Upper | Laterality: Left

## 2017-10-20 MED ORDER — MIRTAZAPINE 15 MG PO TABS
7.5000 mg | ORAL_TABLET | Freq: Every day | ORAL | Status: DC
  Administered 2017-10-20 – 2017-10-27 (×8): 7.5 mg via ORAL
  Filled 2017-10-20 (×10): qty 1

## 2017-10-20 MED ORDER — MEPERIDINE HCL 50 MG/ML IJ SOLN: 6.2500 mg | INTRAMUSCULAR | Status: DC | PRN

## 2017-10-20 MED ORDER — DEXAMETHASONE SODIUM PHOSPHATE 10 MG/ML IJ SOLN
INTRAMUSCULAR | Status: AC
  Filled 2017-10-20: qty 1

## 2017-10-20 MED ORDER — CEFAZOLIN SODIUM-DEXTROSE 2-4 GM/100ML-% IV SOLN: 2.0000 g | INTRAVENOUS | Status: DC

## 2017-10-20 MED ORDER — MENTHOL 3 MG MT LOZG: 1.0000 | LOZENGE | OROMUCOSAL | Status: DC | PRN

## 2017-10-20 MED ORDER — LORAZEPAM 0.5 MG PO TABS
0.5000 mg | ORAL_TABLET | Freq: Two times a day (BID) | ORAL | Status: DC
  Administered 2017-10-20 – 2017-10-21 (×2): 0.5 mg via ORAL
  Filled 2017-10-20 (×2): qty 1

## 2017-10-20 MED ORDER — MORPHINE SULFATE (PF) 4 MG/ML IV SOLN
INTRAVENOUS | Status: AC
  Filled 2017-10-20: qty 1

## 2017-10-20 MED ORDER — MORPHINE SULFATE (PF) 4 MG/ML IV SOLN
INTRAVENOUS | Status: AC
  Administered 2017-10-20: 4 mg via INTRAVENOUS
  Filled 2017-10-20: qty 1

## 2017-10-20 MED ORDER — CHLORHEXIDINE GLUCONATE 4 % EX LIQD: 60.0000 mL | Freq: Once | CUTANEOUS | Status: DC

## 2017-10-20 MED ORDER — HYDROCODONE-ACETAMINOPHEN 5-325 MG PO TABS: 1.0000 | ORAL_TABLET | Freq: Four times a day (QID) | ORAL | 0 refills | Status: AC | PRN

## 2017-10-20 MED ORDER — PHENYLEPHRINE HCL 10 MG/ML IJ SOLN
INTRAVENOUS | Status: DC | PRN
  Administered 2017-10-20: 50 ug/min via INTRAVENOUS

## 2017-10-20 MED ORDER — LIDOCAINE 2% (20 MG/ML) 5 ML SYRINGE
INTRAMUSCULAR | Status: DC | PRN
  Administered 2017-10-20: 60 mg via INTRAVENOUS

## 2017-10-20 MED ORDER — ONDANSETRON HCL 4 MG/2ML IJ SOLN
INTRAMUSCULAR | Status: DC | PRN
  Administered 2017-10-20: 4 mg via INTRAVENOUS

## 2017-10-20 MED ORDER — LIP MEDEX EX OINT
TOPICAL_OINTMENT | CUTANEOUS | Status: AC
  Administered 2017-10-21: 02:00:00
  Filled 2017-10-20: qty 7

## 2017-10-20 MED ORDER — MORPHINE SULFATE (PF) 2 MG/ML IV SOLN
1.0000 mg | INTRAVENOUS | Status: DC | PRN
  Administered 2017-10-20 – 2017-10-21 (×3): 1 mg via INTRAVENOUS
  Filled 2017-10-20 (×3): qty 1

## 2017-10-20 MED ORDER — FENTANYL CITRATE (PF) 100 MCG/2ML IJ SOLN
INTRAMUSCULAR | Status: AC
  Filled 2017-10-20: qty 2

## 2017-10-20 MED ORDER — ACETAMINOPHEN 650 MG RE SUPP: 650.0000 mg | Freq: Four times a day (QID) | RECTAL | Status: DC | PRN

## 2017-10-20 MED ORDER — PROSIGHT PO TABS
1.0000 | ORAL_TABLET | Freq: Two times a day (BID) | ORAL | Status: DC
  Administered 2017-10-20 – 2017-10-28 (×13): 1 via ORAL
  Filled 2017-10-20 (×15): qty 1

## 2017-10-20 MED ORDER — POLYETHYLENE GLYCOL 3350 17 G PO PACK: 17.0000 g | PACK | Freq: Every day | ORAL | Status: DC | PRN

## 2017-10-20 MED ORDER — ONDANSETRON 4 MG PO TBDP: 4.0000 mg | ORAL_TABLET | Freq: Four times a day (QID) | ORAL | Status: DC | PRN

## 2017-10-20 MED ORDER — POVIDONE-IODINE 10 % EX SWAB: 2.0000 "application " | Freq: Once | CUTANEOUS | Status: DC

## 2017-10-20 MED ORDER — ONDANSETRON HCL 4 MG/2ML IJ SOLN: 4.0000 mg | Freq: Four times a day (QID) | INTRAMUSCULAR | Status: DC | PRN

## 2017-10-20 MED ORDER — LACTATED RINGERS IV SOLN
INTRAVENOUS | Status: DC
  Administered 2017-10-20: 1000 mL via INTRAVENOUS

## 2017-10-20 MED ORDER — ONDANSETRON HCL 4 MG PO TABS: 4.0000 mg | ORAL_TABLET | Freq: Four times a day (QID) | ORAL | Status: DC | PRN

## 2017-10-20 MED ORDER — VITAMIN D 1000 UNITS PO TABS
2000.0000 [IU] | ORAL_TABLET | Freq: Every day | ORAL | Status: DC
  Administered 2017-10-21 – 2017-10-28 (×5): 2000 [IU] via ORAL
  Filled 2017-10-20 (×7): qty 2

## 2017-10-20 MED ORDER — SODIUM CHLORIDE 0.9 % IJ SOLN
INTRAMUSCULAR | Status: AC
  Filled 2017-10-20: qty 20

## 2017-10-20 MED ORDER — TRAZODONE HCL 50 MG PO TABS
50.0000 mg | ORAL_TABLET | Freq: Every day | ORAL | Status: DC
  Administered 2017-10-20: 50 mg via ORAL
  Filled 2017-10-20: qty 1

## 2017-10-20 MED ORDER — FENTANYL CITRATE (PF) 100 MCG/2ML IJ SOLN: 25.0000 ug | INTRAMUSCULAR | Status: DC | PRN

## 2017-10-20 MED ORDER — PROPOFOL 10 MG/ML IV BOLUS
INTRAVENOUS | Status: AC
  Filled 2017-10-20: qty 20

## 2017-10-20 MED ORDER — HYDROCODONE-ACETAMINOPHEN 5-325 MG PO TABS
1.0000 | ORAL_TABLET | ORAL | Status: DC | PRN
Start: 2017-10-20 — End: 2017-10-30
  Administered 2017-10-22 – 2017-10-25 (×2): 1 via ORAL
  Filled 2017-10-20 (×2): qty 1

## 2017-10-20 MED ORDER — METOCLOPRAMIDE HCL 5 MG PO TABS: 5.0000 mg | ORAL_TABLET | Freq: Three times a day (TID) | ORAL | Status: DC | PRN

## 2017-10-20 MED ORDER — PHENOL 1.4 % MT LIQD: 1.0000 | OROMUCOSAL | Status: DC | PRN

## 2017-10-20 MED ORDER — ARIPIPRAZOLE 5 MG PO TABS
2.5000 mg | ORAL_TABLET | Freq: Every day | ORAL | Status: DC
  Administered 2017-10-21 – 2017-10-27 (×5): 2.5 mg via ORAL
  Filled 2017-10-20 (×9): qty 1

## 2017-10-20 MED ORDER — HEPARIN SODIUM (PORCINE) 5000 UNIT/ML IJ SOLN: 5000.0000 [IU] | Freq: Three times a day (TID) | INTRAMUSCULAR | Status: DC

## 2017-10-20 MED ORDER — ACETAMINOPHEN 325 MG PO TABS
650.0000 mg | ORAL_TABLET | Freq: Four times a day (QID) | ORAL | Status: DC | PRN
  Administered 2017-10-21: 650 mg via ORAL
  Filled 2017-10-20: qty 2

## 2017-10-20 MED ORDER — ENOXAPARIN SODIUM 40 MG/0.4ML ~~LOC~~ SOLN
40.0000 mg | SUBCUTANEOUS | Status: DC
  Administered 2017-10-21 – 2017-10-28 (×8): 40 mg via SUBCUTANEOUS
  Filled 2017-10-20 (×8): qty 0.4

## 2017-10-20 MED ORDER — FENTANYL CITRATE (PF) 100 MCG/2ML IJ SOLN
INTRAMUSCULAR | Status: DC | PRN
  Administered 2017-10-20 (×4): 25 ug via INTRAVENOUS

## 2017-10-20 MED ORDER — LIDOCAINE 2% (20 MG/ML) 5 ML SYRINGE
INTRAMUSCULAR | Status: AC
  Filled 2017-10-20: qty 5

## 2017-10-20 MED ORDER — DEXAMETHASONE SODIUM PHOSPHATE 10 MG/ML IJ SOLN
INTRAMUSCULAR | Status: DC | PRN
  Administered 2017-10-20: 10 mg via INTRAVENOUS

## 2017-10-20 MED ORDER — DILTIAZEM HCL ER COATED BEADS 180 MG PO CP24
180.0000 mg | ORAL_CAPSULE | Freq: Every day | ORAL | Status: DC
  Administered 2017-10-21: 180 mg via ORAL
  Filled 2017-10-20: qty 1

## 2017-10-20 MED ORDER — PHENYLEPHRINE 40 MCG/ML (10ML) SYRINGE FOR IV PUSH (FOR BLOOD PRESSURE SUPPORT)
PREFILLED_SYRINGE | INTRAVENOUS | Status: DC | PRN
  Administered 2017-10-20 (×2): 80 ug via INTRAVENOUS
  Administered 2017-10-20 (×2): 120 ug via INTRAVENOUS

## 2017-10-20 MED ORDER — MORPHINE SULFATE (PF) 2 MG/ML IV SOLN
1.0000 mg | INTRAVENOUS | Status: DC
  Administered 2017-10-20: 1 mg via INTRAVENOUS

## 2017-10-20 MED ORDER — PRESERVISION AREDS PO CAPS: ORAL_CAPSULE | Freq: Two times a day (BID) | ORAL | Status: DC

## 2017-10-20 MED ORDER — SODIUM CHLORIDE 0.9 % IR SOLN
Status: DC | PRN
  Administered 2017-10-20: 1000 mL

## 2017-10-20 MED ORDER — PHENYLEPHRINE HCL 10 MG/ML IJ SOLN
INTRAMUSCULAR | Status: AC
  Filled 2017-10-20: qty 1

## 2017-10-20 MED ORDER — METOCLOPRAMIDE HCL 5 MG/ML IJ SOLN: 5.0000 mg | Freq: Three times a day (TID) | INTRAMUSCULAR | Status: DC | PRN

## 2017-10-20 MED ORDER — ALBUTEROL SULFATE (5 MG/ML) 0.5% IN NEBU: 5.0000 mg | INHALATION_SOLUTION | Freq: Four times a day (QID) | RESPIRATORY_TRACT | Status: DC | PRN

## 2017-10-20 MED ORDER — ONDANSETRON HCL 4 MG/2ML IJ SOLN
INTRAMUSCULAR | Status: AC
  Filled 2017-10-20: qty 2

## 2017-10-20 MED ORDER — CEFAZOLIN SODIUM-DEXTROSE 2-4 GM/100ML-% IV SOLN
2.0000 g | Freq: Four times a day (QID) | INTRAVENOUS | Status: AC
  Administered 2017-10-20 (×2): 2 g via INTRAVENOUS
  Filled 2017-10-20: qty 100

## 2017-10-20 MED ORDER — DOCUSATE SODIUM 100 MG PO CAPS
100.0000 mg | ORAL_CAPSULE | Freq: Two times a day (BID) | ORAL | Status: DC
  Administered 2017-10-20 – 2017-10-28 (×13): 100 mg via ORAL
  Filled 2017-10-20 (×14): qty 1

## 2017-10-20 MED ORDER — PHENYLEPHRINE 40 MCG/ML (10ML) SYRINGE FOR IV PUSH (FOR BLOOD PRESSURE SUPPORT)
PREFILLED_SYRINGE | INTRAVENOUS | Status: AC
  Filled 2017-10-20: qty 10

## 2017-10-20 MED ORDER — SERTRALINE HCL 50 MG PO TABS
50.0000 mg | ORAL_TABLET | Freq: Every day | ORAL | Status: DC
  Administered 2017-10-21 – 2017-10-27 (×5): 50 mg via ORAL
  Filled 2017-10-20 (×7): qty 1

## 2017-10-20 MED ORDER — BUSPIRONE HCL 5 MG PO TABS
5.0000 mg | ORAL_TABLET | Freq: Two times a day (BID) | ORAL | Status: DC
  Administered 2017-10-20 – 2017-10-27 (×13): 5 mg via ORAL
  Filled 2017-10-20 (×15): qty 1

## 2017-10-20 MED ORDER — CEFAZOLIN SODIUM-DEXTROSE 2-4 GM/100ML-% IV SOLN
INTRAVENOUS | Status: AC
  Filled 2017-10-20: qty 100

## 2017-10-20 MED ORDER — METHOCARBAMOL 500 MG PO TABS: 500.0000 mg | ORAL_TABLET | Freq: Four times a day (QID) | ORAL | 0 refills | Status: AC | PRN

## 2017-10-20 MED ORDER — ALBUTEROL SULFATE (2.5 MG/3ML) 0.083% IN NEBU
2.5000 mg | INHALATION_SOLUTION | Freq: Four times a day (QID) | RESPIRATORY_TRACT | Status: DC | PRN
Start: 2017-10-20 — End: 2017-10-24

## 2017-10-20 MED ORDER — PROPOFOL 10 MG/ML IV BOLUS
INTRAVENOUS | Status: DC | PRN
  Administered 2017-10-20: 110 mg via INTRAVENOUS

## 2017-10-20 SURGICAL SUPPLY — 43 items
BAG ZIPLOCK 12X15 (MISCELLANEOUS) ×3 IMPLANT
BIT DRILL CANN LG 4.3MM (BIT) ×1 IMPLANT
BLADE SAW SGTL 11.0X1.19X90.0M (BLADE) IMPLANT
BNDG GAUZE ELAST 4 BULKY (GAUZE/BANDAGES/DRESSINGS) ×3 IMPLANT
COVER PERINEAL POST (MISCELLANEOUS) ×3 IMPLANT
COVER SURGICAL LIGHT HANDLE (MISCELLANEOUS) ×3 IMPLANT
DRAPE INCISE IOBAN 66X45 STRL (DRAPES) ×3 IMPLANT
DRAPE STERI IOBAN 125X83 (DRAPES) ×3 IMPLANT
DRILL BIT CANN LG 4.3MM (BIT) ×3
DRSG AQUACEL AG ADV 3.5X 4 (GAUZE/BANDAGES/DRESSINGS) IMPLANT
DRSG AQUACEL AG ADV 3.5X 6 (GAUZE/BANDAGES/DRESSINGS) IMPLANT
DRSG MEPILEX BORDER 4X12 (GAUZE/BANDAGES/DRESSINGS) ×3 IMPLANT
DURAPREP 26ML APPLICATOR (WOUND CARE) ×3 IMPLANT
ELECT REM PT RETURN 15FT ADLT (MISCELLANEOUS) ×3 IMPLANT
GAUZE SPONGE 4X4 12PLY STRL (GAUZE/BANDAGES/DRESSINGS) IMPLANT
GLOVE BIOGEL M 7.0 STRL (GLOVE) IMPLANT
GLOVE BIOGEL M STRL SZ7.5 (GLOVE) ×3 IMPLANT
GLOVE BIOGEL PI IND STRL 7.5 (GLOVE) ×2 IMPLANT
GLOVE BIOGEL PI IND STRL 8.5 (GLOVE) ×1 IMPLANT
GLOVE BIOGEL PI INDICATOR 7.5 (GLOVE) ×4
GLOVE BIOGEL PI INDICATOR 8.5 (GLOVE) ×2
GLOVE ECLIPSE 8.0 STRL XLNG CF (GLOVE) IMPLANT
GLOVE ORTHO TXT STRL SZ7.5 (GLOVE) ×9 IMPLANT
GLOVE SURG ORTHO 8.0 STRL STRW (GLOVE) IMPLANT
GOWN STRL REUS W/TWL LRG LVL3 (GOWN DISPOSABLE) ×3 IMPLANT
GOWN STRL REUS W/TWL XL LVL3 (GOWN DISPOSABLE) ×6 IMPLANT
GUIDEPIN 3.2X17.5 THRD DISP (PIN) ×3 IMPLANT
HFN 125 DEG 11MM X 180MM (Orthopedic Implant) ×3 IMPLANT
HIP FRAC NAIL LAG SCR 10.5X100 (Orthopedic Implant) ×2 IMPLANT
KIT BASIN OR (CUSTOM PROCEDURE TRAY) ×3 IMPLANT
MANIFOLD NEPTUNE II (INSTRUMENTS) ×3 IMPLANT
PACK GENERAL/GYN (CUSTOM PROCEDURE TRAY) ×3 IMPLANT
POSITIONER SURGICAL ARM (MISCELLANEOUS) ×3 IMPLANT
SCREW BONE CORTICAL 5.0X38 (Screw) ×3 IMPLANT
SCREW CANN THRD AFF 10.5X100 (Orthopedic Implant) ×1 IMPLANT
STAPLER VISISTAT 35W (STAPLE) ×3 IMPLANT
SUT MNCRL AB 4-0 PS2 18 (SUTURE) ×3 IMPLANT
SUT VIC AB 1 CT1 27 (SUTURE) ×2
SUT VIC AB 1 CT1 27XBRD ANTBC (SUTURE) ×1 IMPLANT
SUT VIC AB 2-0 CT1 27 (SUTURE) ×2
SUT VIC AB 2-0 CT1 27XBRD (SUTURE) IMPLANT
SUT VIC AB 2-0 CT1 TAPERPNT 27 (SUTURE) ×1 IMPLANT
SUT VIC AB 2-0 CT2 27 (SUTURE) ×3 IMPLANT

## 2017-10-20 NOTE — Anesthesia Postprocedure Evaluation (Signed)
Anesthesia Post Note  Patient: [name].  Procedure(s) Performed: INTRAMEDULLARY (IM) NAIL INTERTROCHANTRIC (Left Leg Upper)     Patient location during evaluation: PACU Anesthesia Type: General Level of consciousness: awake and alert Pain management: pain level controlled Vital Signs Assessment: post-procedure vital signs reviewed and stable Respiratory status: spontaneous breathing, nonlabored ventilation, respiratory function stable and patient connected to nasal cannula oxygen Cardiovascular status: blood pressure returned to baseline and stable Postop Assessment: no apparent nausea or vomiting Anesthetic complications: no    Last Vitals:  Vitals:   10/23/2017 1700 10/12/2017 1715  BP: 120/78 128/72  Pulse: (!) 117 (!) 121  Resp: (!) 25   Temp:  36.6 C  SpO2: 97% 93%    Last Pain:  Vitals:   10/24/2017 1700  TempSrc:   PainSc: Asleep                 Alejah Aristizabal

## 2017-10-20 NOTE — H&P (Addendum)
[facility]                                                                                    Patient Demographics  [name], is a 84 y.o. male  CSN: [id number]  MRN: [id number]  DOB - [birth year]  Admit Date - 10/12/2017  Outpatient Primary MD for the patient is Lajean Manes, MD   With History of -  Past Medical History:  Diagnosis Date  . Adenomatous polyps 2003, 2006, 2007  . Bladder cancer ([place])   . CAD (coronary artery disease)   . COPD (chronic obstructive pulmonary disease) ([place])   . Depression   . Diverticulosis 07/2006  . DM (diabetes mellitus) ([place])   . Hemorrhoids, external   . Hemorrhoids, internal   . History of prostate cancer   . HLD (hyperlipidemia)   . HTN (hypertension)   . Iron deficiency anemia   . Macular degeneration   . Normal pressure hydrocephalus   . 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 APPENDECTOMY    . BLADDER SURGERY     cancer  . CATARACT EXTRACTION     with lens implants  . COLONOSCOPY  07/21/2006  . NEPHRECTOMY    . TONSILLECTOMY    . URETHRA SURGERY     stricture  . VENTRICULOPERITONEAL SHUNT  02/2010    in for   Chief Complaint  Patient presents with  . Fall  . Leg Pain     HPI  [name]  is a 84 y.o. male, nursing home resident, with history of bladder cancer, diabetes mellitus, coronary artery disease and COPD in addition to advanced dementia who was brought to the emergency room after a ground level fall and resulting left hip fracture noted by x-ray.  Family is not around & the patient is confused and cannot give any history.  Orthopedic surgery/Dr. Alvan Dame is on consult    Review of Systems    In addition to the HPI above,  No Fever-chills, No Headache, No changes with Vision or hearing, No problems swallowing food or Liquids, No Chest pain, Cough or Shortness of Breath, No Abdominal pain, No Nausea or Vommitting, Bowel movements are  regular, No Blood in stool or Urine, No dysuria, No new skin rashes or bruises, No new joints pains-aches,  No new weakness, tingling, numbness in any extremity, No recent weight gain or loss, No polyuria, polydypsia or polyphagia, No significant Mental Stressors.  A full 10 point Review of Systems was done, except as stated above, all other Review of Systems were negative.   Social History Social History   Tobacco Use  . Smoking status: Former Smoker    Types: Cigarettes  . Smokeless tobacco: Never Used  Substance Use Topics  . Alcohol use: No     Family History Family History  Problem Relation Age of Onset  . Coronary artery disease Father      Prior to Admission medications   Medication Sig Start Date End Date Taking? Authorizing Provider  albuterol (2.5 MG/3ML) 0.083% NEBU 3 mL, albuterol (5 MG/ML) 0.5% NEBU 0.5 mL Inhale 5  mg into the lungs every 6 (six) hours as needed (wheezing/sob).   Yes [provider]  ARIPiprazole (ABILIFY) 5 MG tablet Take 2.5 mg by mouth daily.   Yes [provider]  aspirin 81 MG tablet Take 81 mg by mouth daily.   Yes [provider]  busPIRone (BUSPAR) 5 MG tablet Take 5 mg by mouth 2 (two) times daily.    Yes [provider]  Cholecalciferol (VITAMIN D) 2000 UNITS CAPS Take 2,000 Units by mouth daily.    Yes [provider]  diltiazem (CARDIZEM CD) 180 MG 24 hr capsule TAKE 1 CAPSULE BY MOUTH DAILY 03/27/11  Yes Plotnikov, Evie Lacks, MD  guaiFENesin (MUCINEX) 600 MG 12 hr tablet Take 600 mg by mouth daily.   Yes [provider]  LORazepam (ATIVAN) 0.5 MG tablet Take 0.5 mg by mouth 2 (two) times daily.    Yes [provider]  mirtazapine (REMERON) 7.5 MG tablet Take 7.5 mg by mouth at bedtime.   Yes [provider]  Multiple Vitamins-Minerals (PRESERVISION AREDS PO) Take 1 tablet by mouth 2 (two) times daily.   Yes [provider]  Omega-3 Fatty Acids (FISH  OIL) 1200 MG CAPS Take 1,200 mg by mouth 2 (two) times daily.    Yes [provider]  ondansetron (ZOFRAN-ODT) 4 MG disintegrating tablet Take 4 mg by mouth every 6 (six) hours as needed for nausea or vomiting.   Yes [provider]  sertraline (ZOLOFT) 50 MG tablet Take 50 mg by mouth daily.   Yes [provider]  traZODone (DESYREL) 50 MG tablet Take 50 mg by mouth at bedtime 05/23/17  Yes [provider]  cephALEXin (KEFLEX) 500 MG capsule Take 1 capsule (500 mg total) by mouth 3 (three) times daily. Patient not taking: Reported on 10/31/2017 09/11/17   Jose Schmidt, MD  budesonide-formoterol [facility]) 80-4.5 MCG/ACT inhaler Inhale 2 puffs into the lungs 2 (two) times daily.  10/22/11  [provider]  buPROPion (WELLBUTRIN SR) 100 MG 12 hr tablet Take 100 mg by mouth every morning.  10/22/11  [provider]  tiotropium (SPIRIVA) 18 MCG inhalation capsule Place 18 mcg into inhaler and inhale daily.  10/22/11  [provider]    Allergies  Allergen Reactions  . Iron Hives  . Melatonin     Unknown reaction per MAR   . Mirtazapine Other (See Comments)    too drowzy  . Sulfonamide Derivatives Hives    Physical Exam  Vitals  Blood pressure 138/77, pulse (!) 110, temperature 98 F (36.7 C), temperature source Axillary, resp. rate (!) 25, SpO2 93 %.   1. General well-developed well-nourished male  2.  Flat affect and insight, Not Suicidal or Homicidal, Awake pleasantly confused.  3. No F.N deficits, grossly, patient moving all extremities.  Complete neurological examination was not done due to patient's confusion .  4. Ears and Eyes appear Normal, Conjunctivae clear, PERRLA. Moist Oral Mucosa.  5. Supple Neck, No JVD, No cervical lymphadenopathy appriciated, No Carotid Bruits.  6. Symmetrical Chest wall movement, Good air movement bilaterally, CTAB.  7. RRR, No Gallops, Rubs or Murmurs, No Parasternal Heave.  8.  Positive Bowel Sounds, Abdomen Soft, Non tender, No organomegaly appriciated,No rebound -guarding or rigidity.  9.  No Cyanosis, Normal Skin Turgor, No Skin Rash or Bruise.  10. Good muscle tone,  joints appear normal , no effusions, Normal ROM.    Data Review  CBC Recent Labs  Lab 10/26/2017 0015  WBC 11.5*  HGB 12.0*  HCT 36.5*  PLT 284  MCV 92.2  MCH 30.3  MCHC 32.9  RDW 14.8  LYMPHSABS 1.6  MONOABS 1.0  EOSABS 0.1  BASOSABS 0.0   ------------------------------------------------------------------------------------------------------------------  Chemistries  Recent Labs  Lab 10/14/2017 0015  NA 137  K 4.1  CL 103  CO2 25  GLUCOSE 126*  BUN 21*  CREATININE 1.09  CALCIUM 8.4*   ------------------------------------------------------------------------------------------------------------------ CrCl cannot be calculated (Unknown ideal weight.). ------------------------------------------------------------------------------------------------------------------ No results for input(s): TSH, T4TOTAL, T3FREE, THYROIDAB in the last 72 hours.  Invalid input(s): FREET3   Coagulation profile Recent Labs  Lab 10/22/2017 0015  INR 1.01   ------------------------------------------------------------------------------------------------------------------- No results for input(s): DDIMER in the last 72 hours. -------------------------------------------------------------------------------------------------------------------  Cardiac Enzymes No results for input(s): CKMB, TROPONINI, MYOGLOBIN in the last 168 hours.  Invalid input(s): CK ------------------------------------------------------------------------------------------------------------------ Invalid input(s): POCBNP   ---------------------------------------------------------------------------------------------------------------  Urinalysis    Component Value Date/Time   COLORURINE YELLOW 09/04/2017 0109    APPEARANCEUR CLEAR 09/04/2017 0109   LABSPEC 1.016 09/04/2017 0109   PHURINE 5.0 09/04/2017 0109   GLUCOSEU NEGATIVE 09/04/2017 0109   GLUCOSEU NEGATIVE 06/12/2008 1003   HGBUR NEGATIVE 09/04/2017 0109   BILIRUBINUR NEGATIVE 09/04/2017 0109   KETONESUR NEGATIVE 09/04/2017 0109   PROTEINUR NEGATIVE 09/04/2017 0109   UROBILINOGEN 1.0 03/10/2010 1456   NITRITE NEGATIVE 09/04/2017 0109   LEUKOCYTESUR SMALL (A) 09/04/2017 0109    ----------------------------------------------------------------------------------------------------------------   Imaging results:   Ct Head Wo Contrast  Result Date: 11/05/2017 CLINICAL DATA:  Left leg pain after fall from standing. Head trauma. EXAM: CT HEAD WITHOUT CONTRAST TECHNIQUE: Contiguous axial images were obtained from the base of the skull through the vertex without intravenous contrast. COMPARISON:  05/29/2011 FINDINGS: Brain: Chronic stable ventriculomegaly with shunt place from a right frontal approach. Patchy small vessel ischemic disease. No acute intracranial hemorrhage, midline shift or edema. No large vascular territory infarct. No intra-axial mass nor extra-axial fluid collections. Vascular: No hyperdense vessel sign. Moderate atherosclerosis of carotid siphons. Skull: No fracture nor bone destruction. Sinuses/Orbits: Bilateral lens replacements. Clear mastoids. Minimal mucosal thickening along the posterior left maxillary sinus mild ethmoid sinus mucosal thickening noted as well. Other: None IMPRESSION: Ventriculomegaly with shunt in place. No acute intracranial abnormality. Chronic small vessel ischemic disease. Electronically Signed   By: Ashley Royalty M.D.   On: 10/21/2017 23:30   Dg Chest Portable 1 View  Result Date: 10/23/2017 CLINICAL DATA:  57-year-old male with hip fracture. Preop radiograph. EXAM: PORTABLE CHEST 1 VIEW COMPARISON:  Chest radiograph dated 10/28/2012 FINDINGS: Emphysema. No focal consolidation, pleural effusion, or  pneumothorax. The cardiac silhouette is within normal limits. Atherosclerotic calcification of the aortic arch. Partially visualized VP shunt over the right chest. IMPRESSION: No active disease. Electronically Signed   By: Anner Crete M.D.   On: 11/07/2017 00:41   Dg Hip Unilat With Pelvis 2-3 Views Left  Result Date: 10/24/2017 CLINICAL DATA:  Unwitnessed fall.  Hip pain. EXAM: DG HIP (WITH OR WITHOUT PELVIS) 2-3V LEFT COMPARISON:  None. FINDINGS: Acute, comminuted varus angulated intertrochanteric fracture of the left femur with avulsed lesser trochanter. No femoral head dislocation. Surgical clips project over the left hemipelvis. No pelvic fracture. Lower lumbar degenerative disc and facet arthropathy from L4 caudad. IMPRESSION: Acute, comminuted varus angulated intertrochanteric fracture of the left femur with avulsed lesser trochanter. Electronically Signed   By: Ashley Royalty M.D.   On: 11/01/2017 23:32   Dg Femur 1v Left  Result Date: 10/19/2017 CLINICAL DATA:  Fracture of the left hip EXAM: LEFT FEMUR 1 VIEW COMPARISON:  Left hip radiographs acquired sequentially. FINDINGS: AP view of the mid to distal left femur was provided in conjunction with previous same day hip radiographs. The patient has a known left intertrochanteric fracture of the left femur with avulsed lesser trochanter. The visualized femoral shaft and condyles appear intact on this single frontal view of the mid to distal femur. IMPRESSION: No fracture of the mid to distal femur nor femoral condyles. Please see the left hip radiograph report for fracture of the left intertrochanter with avulsed lesser trochanter. Electronically Signed   By: Ashley Royalty M.D.   On: 11/03/2017 23:34      Assessment & Plan  1.  Left hip fracture after a fall      Dr. Alvan Dame on consult      Not able to get family at this time 2.  Advanced dementia combined with worsening confusion probably from pain medication/history of normal pressure  hydrocephalus 3.  History of diabetes mellitus 4.  History of bladder cancer 5.  History of coronary artery disease .  Plan  Continue with medications Awaiting family Hold aspirin   DVT Prophylaxis Heparin   AM Labs Ordered, also please review Full Orders  Code Status full  Disposition Plan: Undetermined  Time spent in minutes : 34 minutes  Condition GUARDED   @SIGNATURE @

## 2017-10-20 NOTE — ED Notes (Signed)
ED TO INPATIENT HANDOFF REPORT  Name/Age/Gender [name]. 84 y.o. male  Code Status Code Status History    Date Active Date Inactive Code Status Order ID Comments User Context   09/03/2017 22:15 09/04/2017 15:00 Full Code [id number]  Delia Heady, PA-C ED      Home/SNF/Other Home  Chief Complaint Fa;; Leg Pain  Level of Care/Admitting Diagnosis ED Disposition    ED Disposition Condition [facility] Area: [facility] [132440]  Level of Care: Med-Surg [16]  Diagnosis: Hip fracture [facility]) [[id number]]  Admitting Physician: Rise Patience [phone]  Attending Physician: Rise Patience [phone]  Estimated length of stay: past midnight tomorrow  Certification:: I certify this patient will need inpatient services for at least 2 midnights  PT Class (Do Not Modify): Inpatient [101]  PT Acc Code (Do Not Modify): Private [1]       Medical History Past Medical History:  Diagnosis Date  . Adenomatous polyps 2003, 2006, 2007  . Bladder cancer ([place])   . CAD (coronary artery disease)   . COPD (chronic obstructive pulmonary disease) ([place])   . Depression   . Diverticulosis 07/2006  . DM (diabetes mellitus) ([place])   . Hemorrhoids, external   . Hemorrhoids, internal   . History of prostate cancer   . HLD (hyperlipidemia)   . HTN (hypertension)   . Iron deficiency anemia   . Macular degeneration   . Normal pressure hydrocephalus   . 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 Iron Hives  . Melatonin     Unknown reaction per MAR   . Mirtazapine Other (See Comments)    too drowzy  . Sulfonamide Derivatives Hives    IV Location/Drains/Wounds Patient Lines/Drains/Airways Status   Active Line/Drains/Airways    Name:   Placement date:   Placement time:   Site:   Days:   Peripheral IV 10/12/2017   10/18/2017    0010    -   less than 1          Labs/Imaging Results for orders placed or performed during the  hospital encounter of 10/17/2017 (from the past 48 hour(s))  Basic metabolic panel     Status: Abnormal   Collection Time: 10/14/2017 12:15 AM  Result Value Ref Range   Sodium 137 135 - 145 mmol/L   Potassium 4.1 3.5 - 5.1 mmol/L   Chloride 103 101 - 111 mmol/L   CO2 25 22 - 32 mmol/L   Glucose, Bld 126 (H) 65 - 99 mg/dL   BUN 21 (H) 6 - 20 mg/dL   Creatinine, Ser 1.09 0.61 - 1.24 mg/dL   Calcium 8.4 (L) 8.9 - 10.3 mg/dL   GFR calc non Af Amer 58 (L) >60 mL/min   GFR calc Af Amer >60 >60 mL/min    Comment: (NOTE) The eGFR has been calculated using the CKD EPI equation. This calculation has not been validated in all clinical situations. eGFR's persistently <60 mL/min signify possible Chronic Kidney Disease.    Anion gap 9 5 - 15    Comment: Performed at [facility], [place] [address], [place], [place] [address]  CBC WITH DIFFERENTIAL     Status: Abnormal   Collection Time: 10/26/2017 12:15 AM  Result Value Ref Range   WBC 11.5 (H) 4.0 - 10.5 K/uL   RBC 3.96 (L) 4.22 - 5.81 MIL/uL   Hemoglobin 12.0 (L) 13.0 - 17.0 g/dL  HCT 36.5 (L) 39.0 - 52.0 %   MCV 92.2 78.0 - 100.0 fL   MCH 30.3 26.0 - 34.0 pg   MCHC 32.9 30.0 - 36.0 g/dL   RDW 14.8 11.5 - 15.5 %   Platelets 284 150 - 400 K/uL   Neutrophils Relative % 76 %   Neutro Abs 8.7 (H) 1.7 - 7.7 K/uL   Lymphocytes Relative 14 %   Lymphs Abs 1.6 0.7 - 4.0 K/uL   Monocytes Relative 9 %   Monocytes Absolute 1.0 0.1 - 1.0 K/uL   Eosinophils Relative 1 %   Eosinophils Absolute 0.1 0.0 - 0.7 K/uL   Basophils Relative 0 %   Basophils Absolute 0.0 0.0 - 0.1 K/uL    Comment: Performed at [facility], [place] [address] [place], [place] [address]  Protime-INR     Status: None   Collection Time: 10/25/2017 12:15 AM  Result Value Ref Range   Prothrombin Time 13.2 11.4 - 15.2 seconds   INR 1.01     Comment: Performed at [facility], [place] [address], [place], [place] [address]  Type and  screen [place]     Status: None   Collection Time: 10/27/2017 12:15 AM  Result Value Ref Range   ABO/RH(D) O POS    Antibody Screen NEG    Sample Expiration      10/23/2017 Performed at [facility], [place] [address], [place], [place] [address]    Ct Head Wo Contrast  Result Date: 10/22/2017 CLINICAL DATA:  Left leg pain after fall from standing. Head trauma. EXAM: CT HEAD WITHOUT CONTRAST TECHNIQUE: Contiguous axial images were obtained from the base of the skull through the vertex without intravenous contrast. COMPARISON:  05/29/2011 FINDINGS: Brain: Chronic stable ventriculomegaly with shunt place from a right frontal approach. Patchy small vessel ischemic disease. No acute intracranial hemorrhage, midline shift or edema. No large vascular territory infarct. No intra-axial mass nor extra-axial fluid collections. Vascular: No hyperdense vessel sign. Moderate atherosclerosis of carotid siphons. Skull: No fracture nor bone destruction. Sinuses/Orbits: Bilateral lens replacements. Clear mastoids. Minimal mucosal thickening along the posterior left maxillary sinus mild ethmoid sinus mucosal thickening noted as well. Other: None IMPRESSION: Ventriculomegaly with shunt in place. No acute intracranial abnormality. Chronic small vessel ischemic disease. Electronically Signed   By: Ashley Royalty M.D.   On: 10/15/2017 23:30   Dg Chest Portable 1 View  Result Date: 10/10/2017 CLINICAL DATA:  6-year-old male with hip fracture. Preop radiograph. EXAM: PORTABLE CHEST 1 VIEW COMPARISON:  Chest radiograph dated 10/28/2012 FINDINGS: Emphysema. No focal consolidation, pleural effusion, or pneumothorax. The cardiac silhouette is within normal limits. Atherosclerotic calcification of the aortic arch. Partially visualized VP shunt over the right chest. IMPRESSION: No active disease. Electronically Signed   By: Anner Crete M.D.   On: 10/22/2017 00:41   Dg Hip Unilat With Pelvis  2-3 Views Left  Result Date: 10/18/2017 CLINICAL DATA:  Unwitnessed fall.  Hip pain. EXAM: DG HIP (WITH OR WITHOUT PELVIS) 2-3V LEFT COMPARISON:  None. FINDINGS: Acute, comminuted varus angulated intertrochanteric fracture of the left femur with avulsed lesser trochanter. No femoral head dislocation. Surgical clips project over the left hemipelvis. No pelvic fracture. Lower lumbar degenerative disc and facet arthropathy from L4 caudad. IMPRESSION: Acute, comminuted varus angulated intertrochanteric fracture of the left femur with avulsed lesser trochanter. Electronically Signed   By: Ashley Royalty M.D.   On: 10/15/2017 23:32   Dg Femur 1v Left  Result Date: 10/28/2017 CLINICAL DATA:  Fracture of the left hip EXAM: LEFT FEMUR 1 VIEW COMPARISON:  Left hip radiographs acquired sequentially. FINDINGS: AP view of the mid to distal left femur was provided in conjunction with previous same day hip radiographs. The patient has a known left intertrochanteric fracture of the left femur with avulsed lesser trochanter. The visualized femoral shaft and condyles appear intact on this single frontal view of the mid to distal femur. IMPRESSION: No fracture of the mid to distal femur nor femoral condyles. Please see the left hip radiograph report for fracture of the left intertrochanter with avulsed lesser trochanter. Electronically Signed   By: Ashley Royalty M.D.   On: 10/25/2017 23:34    Pending Labs Unresulted Labs (From admission, onward)   Start     Ordered   11/07/2017 0015  ABO/Rh  Once,   R     11/02/2017 0015      Vitals/Pain Today's Vitals   10/22/2017 0237 10/12/2017 0300 11/02/2017 0330 10/21/2017 0400  BP: (!) 147/76 139/90 131/86 129/81  Pulse: (!) 103 (!) 106 (!) 107 (!) 109  Resp: 14 16 (!) 28 16  Temp:      TempSrc:      SpO2: 95% 94% 98% 97%    Isolation Precautions No active isolations  Medications Medications  0.9 %  sodium chloride infusion ( Intravenous New Bag/Given 10/13/2017 0042)  morphine 4  MG/ML injection 4 mg (4 mg Intravenous Given 10/23/2017 0133)    Mobility non-ambulatory

## 2017-10-20 NOTE — Anesthesia Procedure Notes (Signed)
Procedure Name: LMA Insertion Date/Time: 10/26/2017 2:35 PM Performed by: Sharlette Dense, CRNA Patient Re-evaluated:Patient Re-evaluated prior to induction Oxygen Delivery Method: Circle system utilized Preoxygenation: Pre-oxygenation with 100% oxygen Induction Type: IV induction LMA: LMA with gastric port inserted LMA Size: 4.0 Number of attempts: 1 Placement Confirmation: positive ETCO2 and breath sounds checked- equal and bilateral Tube secured with: Tape Dental Injury: Teeth and Oropharynx as per pre-operative assessment

## 2017-10-20 NOTE — Anesthesia Preprocedure Evaluation (Addendum)
Anesthesia Evaluation  Patient identified by MRN, date of birth, ID band Patient awake    Reviewed: Allergy & Precautions, NPO status , Patient's Chart, lab work & pertinent test results  Airway Mallampati: II  TM Distance: >3 FB Neck ROM: Full    Dental no notable dental hx.    Pulmonary COPD, former smoker,    Pulmonary exam normal breath sounds clear to auscultation       Cardiovascular hypertension, + CAD  Normal cardiovascular exam Rhythm:Regular Rate:Normal  EKG Sinus rhythm Right bundle branch block   Neuro/Psych Depression negative neurological ROS     GI/Hepatic negative GI ROS, Neg liver ROS,   Endo/Other  diabetes  Renal/GU negative Renal ROS  negative genitourinary   Musculoskeletal negative musculoskeletal ROS (+)   Abdominal   Peds negative pediatric ROS (+)  Hematology negative hematology ROS (+) anemia ,   Anesthesia Other Findings . Bladder cancer ([place])  . CAD (coronary artery disease)  . COPD (chronic obstructive pulmonary disease)   . Depression  . Diverticulosis 07/2006 . DM (diabetes mellitus) ([place])  . History of prostate cancer  . HLD (hyperlipidemia)  . HTN (hypertension)  . Iron deficiency anemia  . Macular degeneration  . Normal pressure hydrocephalus  . 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[place])      Reproductive/Obstetrics negative OB ROS                            Anesthesia Physical Anesthesia Plan  ASA: III and emergent  Anesthesia Plan: General   Post-op Pain Management:    Induction: Intravenous  PONV Risk Score and Plan: 2 and Treatment may vary due to age or medical condition and Ondansetron  Airway Management Planned: LMA and Oral ETT  Additional Equipment:   Intra-op Plan:   Post-operative Plan: Extubation in OR  Informed Consent: I have reviewed the patients History and Physical, chart, labs and discussed the procedure including the  risks, benefits and alternatives for the proposed anesthesia with the patient or authorized representative who has indicated his/her understanding and acceptance.     Plan Discussed with: CRNA, Surgeon and Anesthesiologist  Anesthesia Plan Comments: ( )        Anesthesia Quick Evaluation

## 2017-10-20 NOTE — Discharge Instructions (Signed)

## 2017-10-20 NOTE — Transfer of Care (Signed)
Immediate Anesthesia Transfer of Care Note  Patient: [name].  Procedure(s) Performed: INTRAMEDULLARY (IM) NAIL INTERTROCHANTRIC (Left Leg Upper)  Patient Location: PACU  Anesthesia Type:General  Level of Consciousness: sedated  Airway & Oxygen Therapy: Patient Spontanous Breathing and Patient connected to face mask oxygen  Post-op Assessment: Report given to RN and Post -op Vital signs reviewed and stable  Post vital signs: Reviewed and stable  Last Vitals:  Vitals:   10/28/2017 1147 10/11/2017 1400  BP: 134/65 138/81  Pulse: (!) 108 (!) 110  Resp: 16   Temp: 36.7 C   SpO2: 97%     Last Pain:  Vitals:   10/15/2017 1147  TempSrc: Oral         Complications: No apparent anesthesia complications

## 2017-10-20 NOTE — Consult Note (Signed)
Reason for Consult: Left intertrochanteric femur fracture, comminuted Referring Physician: Laren Everts, MD (Hospitalist)  [name]. is an 84 y.o. male.  HPI: 84 year old male presented to the emergency room after a ground-level fall.  He is noted to be demented.  General medical history reviewed from his intake information in the ER as well as the hospitalist.  He was noted to have left hip pain and deformity with movement.  X-rays were ordered and showed a comminuted left intertrochanteric femur fracture.  Following admission through the emergency room orthopedics was consulted for definitive management.  At this point no family available to discuss current injury and treatment recommendations.  Past Medical History:  Diagnosis Date  . Adenomatous polyps 2003, 2006, 2007  . Bladder cancer ([place])   . CAD (coronary artery disease)   . COPD (chronic obstructive pulmonary disease) ([place])   . Depression   . Diverticulosis 07/2006  . DM (diabetes mellitus) ([place])   . Hemorrhoids, external   . Hemorrhoids, internal   . History of prostate cancer   . HLD (hyperlipidemia)   . HTN (hypertension)   . Iron deficiency anemia   . Macular degeneration   . Normal pressure hydrocephalus   . 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 APPENDECTOMY    . BLADDER SURGERY     cancer  . CATARACT EXTRACTION     with lens implants  . COLONOSCOPY  07/21/2006  . NEPHRECTOMY    . TONSILLECTOMY    . URETHRA SURGERY     stricture  . 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 Coronary artery disease Father     Social History:  reports that he has quit smoking. His smoking use included cigarettes. he has never used smokeless tobacco. He reports that he does not drink alcohol or use drugs.  Allergies:  Allergies  Allergen Reactions  . Iron Hives  . Melatonin     Unknown reaction per MAR   . Mirtazapine Other (See  Comments)    too drowzy  . Sulfonamide Derivatives Hives    Medications: I have reviewed the patient's current medications. Scheduled:   Results for orders placed or performed during the hospital encounter of 10/30/2017 (from the past 24 hour(s))  Basic metabolic panel     Status: Abnormal   Collection Time: 10/19/2017 12:15 AM  Result Value Ref Range   Sodium 137 135 - 145 mmol/L   Potassium 4.1 3.5 - 5.1 mmol/L   Chloride 103 101 - 111 mmol/L   CO2 25 22 - 32 mmol/L   Glucose, Bld 126 (H) 65 - 99 mg/dL   BUN 21 (H) 6 - 20 mg/dL   Creatinine, Ser 1.09 0.61 - 1.24 mg/dL   Calcium 8.4 (L) 8.9 - 10.3 mg/dL   GFR calc non Af Amer 58 (L) >60 mL/min   GFR calc Af Amer >60 >60 mL/min   Anion gap 9 5 - 15  CBC WITH DIFFERENTIAL     Status: Abnormal   Collection Time: 10/17/2017 12:15 AM  Result Value Ref Range   WBC 11.5 (H) 4.0 - 10.5 K/uL   RBC 3.96 (L) 4.22 - 5.81 MIL/uL   Hemoglobin 12.0 (L) 13.0 - 17.0 g/dL   HCT 36.5 (L) 39.0 - 52.0 %   MCV 92.2 78.0 - 100.0 fL   MCH 30.3 26.0 - 34.0 pg   MCHC 32.9 30.0 -  36.0 g/dL   RDW 14.8 11.5 - 15.5 %   Platelets 284 150 - 400 K/uL   Neutrophils Relative % 76 %   Neutro Abs 8.7 (H) 1.7 - 7.7 K/uL   Lymphocytes Relative 14 %   Lymphs Abs 1.6 0.7 - 4.0 K/uL   Monocytes Relative 9 %   Monocytes Absolute 1.0 0.1 - 1.0 K/uL   Eosinophils Relative 1 %   Eosinophils Absolute 0.1 0.0 - 0.7 K/uL   Basophils Relative 0 %   Basophils Absolute 0.0 0.0 - 0.1 K/uL  Protime-INR     Status: None   Collection Time: 11/01/2017 12:15 AM  Result Value Ref Range   Prothrombin Time 13.2 11.4 - 15.2 seconds   INR 1.01   Type and screen [place]     Status: None   Collection Time: 11/07/2017 12:15 AM  Result Value Ref Range   ABO/RH(D) O POS    Antibody Screen NEG    Sample Expiration      10/23/2017 Performed at [facility], [place] [address], [place], [place] [address]   ABO/Rh     Status: None   Collection  Time: 10/14/2017 12:15 AM  Result Value Ref Range   ABO/RH(D)      O POS Performed at [facility], [place] [address] [place], [place] [address]     X-ray: CLINICAL DATA:  Unwitnessed fall.  Hip pain.  EXAM: DG HIP (WITH OR WITHOUT PELVIS) 2-3V LEFT  COMPARISON:  None.  FINDINGS: Acute, comminuted varus angulated intertrochanteric fracture of the left femur with avulsed lesser trochanter. No femoral head dislocation. Surgical clips project over the left hemipelvis. No pelvic fracture. Lower lumbar degenerative disc and facet arthropathy from L4 caudad.  IMPRESSION: Acute, comminuted varus angulated intertrochanteric fracture of the left femur with avulsed lesser trochanter.   Electronically Signed   By: Ashley Royalty M.D.  ROS  Patient demented.  Review of systems per medical admitting team. Positive for acute fall and injury to left hip.  Blood pressure 138/77, pulse (!) 110, temperature 98 F (36.7 C), temperature source Axillary, resp. rate (!) 25, SpO2 93 %.  Physical Exam  Patient was seen and evaluated this morning he was awake and alert but confused.  General medical exam reviewed from the admitting history and physical as well as emergency room for pertinent variables.  His left hip is slightly shortened and actually rotated with pain with any movement.  There is no evidence of any cuts or breaks over the lateral side of his hip.  Assessment: Closed left comminuted intertrochanteric femur fracture.  Plan: At this current time we will leave him n.p.o.  I do not feel that I will be able to get to his hip today.  I will need to speak to family to discuss intervention, desire for treatment, risks and benefits.  If we do not hear from from family by noon we will change his diet restriction.  I may need to find assistance from 1 of my partners to get him addressed sooner than later if not today.  Mauri Pole 10/09/2017, 10:20 AM

## 2017-10-20 NOTE — Op Note (Signed)
[name], [name]               ACCOUNT NO.:  [id number]  MEDICAL RECORD NO.:  [id number]  LOCATION:  [facility]                        FACILITY:  [facility]  PHYSICIAN:  Jose Cassis. Alvan Hardy, M.D.  DATE OF BIRTH:  1925/06/18  DATE OF PROCEDURE:  11/07/2017 DATE OF DISCHARGE:                              OPERATIVE REPORT   REOPERATIVE DIAGNOSIS:  Comminuted left intertrochanteric femur fracture.  POSTOPERATIVE DIAGNOSIS:  Comminuted left intertrochanteric femur fracture.  PROCEDURE:  Open reduction and internal fixation of left intertrochanteric femur fracture.  COMPONENTS USED:  Biomet Affixus nail, 11 x 180 mm with 125-degree lag screw measuring 100 mm and one distal interlock measuring 38 mm.  SURGEON:  Jose Cassis. Alvan Hardy, M.D.  ASSISTANT:  Surgical team.  ANESTHESIA:  General.  SPECIMENS:  None.  COMPLICATION:  None.  BLOOD LOSS:  Minimal.  INDICATIONS FOR PROCEDURE:  [name] is a 84 year old male who had presented to the emergency room after an unwitnessed fall at [facility].  Radiographs revealed a comminuted left intertrochanteric femur fracture.  He was seen and evaluated, admitted to the Hospitalist Service.  We spoke to his power of attorney to obtain consent.  The indications for the procedure were discussed, the risks of infection, DVT, component failure, need for future surgery as well as comorbid risks of increased mortality during the next year.  Consent was obtained for benefit of fracture care and pain relief.  PROCEDURE IN DETAIL:  The patient was brought to the operative theater. Once adequate anesthesia, preoperative antibiotics, Ancef administered, he was positioned supine on the fracture table.  His left foot was placed into the traction boot.  His right lower extremity was then flexed and abducted out of the way for radiographic purposes.  Traction and internal rotation were applied to the left lower extremity. Fluoroscopy confirmed anatomic  reduction of the fracture.  At this point, the left lower extremity was prepped and draped in sterile fashion using the shower curtain technique.  The fluoroscopy was brought back to the field identifying landmarks.  An incision was then made proximal to the lateral aspect of the trochanter.  Soft tissue dissection was carried down through the gluteal fascia.  The tip of the trochanter was then identified and guidewire was inserted.  Once this was confirmed radiographically, the proximal femur was reamed.  The 11 x 180-mm nail had been selected and then was inserted on the insertion jig to its appropriate depth.  Once it was in its appropriate depth, the guidewire was then inserted into the center portion of the femoral head confirmed in the AP and lateral planes.  I then measured the depths, selected a 100-mm nail.  I then reamed for this and then placed the screw.  Compression wheel was used to medialize the fracture to the neck segment.  Once this was confirmed, a distal interlock screw was placed.  Final radiographs were obtained in AP and lateral planes.  All wounds were irrigated.  The gluteal fascia was then reapproximated using #1 Vicryl.  The remainder of the wound was closed with 2-0 Vicryl and staples on the skin.  The skin was cleaned, dried and dressed sterilely using a Mepilex dressing.  The patient was then brought to the recovery room in stable condition.  Findings were reviewed with his power of attorney, [name].  We will allow him to be weightbearing as tolerated for transfers.  I did not anticipate the need for physical therapy due to his age and comorbidities.  He is at high-fall risk.     [name], M.D.     MDO/MEDQ  D:  11/02/2017  T:  10/30/2017  Job:  [id number]

## 2017-10-20 NOTE — ED Provider Notes (Signed)
Patient initially seen by Dr. Tomi Bamberger with fall and left hip fracture, signed out to me to evaluate lab workup and to arrange hospitalization.  Screening labs are unremarkable.  Mild anemia is present but normal renal function.  Case is discussed with Dr. Hal Hope of [facility] hospitalist who agrees to admit the patient.   Delora Fuel, MD 46/19/01 [phone]

## 2017-10-21 DIAGNOSIS — F329 Major depressive disorder, single episode, unspecified: Secondary | ICD-10-CM

## 2017-10-21 DIAGNOSIS — D62 Acute posthemorrhagic anemia: Secondary | ICD-10-CM

## 2017-10-21 DIAGNOSIS — S72002A Fracture of unspecified part of neck of left femur, initial encounter for closed fracture: Secondary | ICD-10-CM

## 2017-10-21 DIAGNOSIS — F32A Depression, unspecified: Secondary | ICD-10-CM

## 2017-10-21 LAB — CBC
HCT: 24.9 % — ABNORMAL LOW (ref 39.0–52.0)
Hemoglobin: 8.3 g/dL — ABNORMAL LOW (ref 13.0–17.0)
MCH: 30.9 pg (ref 26.0–34.0)
MCHC: 33.3 g/dL (ref 30.0–36.0)
MCV: 92.6 fL (ref 78.0–100.0)
PLATELETS: 220 10*3/uL (ref 150–400)
RBC: 2.69 MIL/uL — ABNORMAL LOW (ref 4.22–5.81)
RDW: 15.2 % (ref 11.5–15.5)
WBC: 12.6 10*3/uL — ABNORMAL HIGH (ref 4.0–10.5)

## 2017-10-21 LAB — BASIC METABOLIC PANEL
Anion gap: 6 (ref 5–15)
BUN: 22 mg/dL — AB (ref 6–20)
CO2: 25 mmol/L (ref 22–32)
CREATININE: 0.96 mg/dL (ref 0.61–1.24)
Calcium: 8.1 mg/dL — ABNORMAL LOW (ref 8.9–10.3)
Chloride: 108 mmol/L (ref 101–111)
GFR calc Af Amer: >60 mL/min (ref >60)
Glucose, Bld: 139 mg/dL — ABNORMAL HIGH (ref 65–99)
Potassium: 4.6 mmol/L (ref 3.5–5.1)
Sodium: 139 mmol/L (ref 135–145)

## 2017-10-21 LAB — FERRITIN: FERRITIN: 76 ng/mL (ref 24–336)

## 2017-10-21 LAB — RETICULOCYTES
RBC.: 2.51 MIL/uL — ABNORMAL LOW (ref 4.22–5.81)
Retic Count, Absolute: 57.7 10*3/uL (ref 19.0–186.0)
Retic Ct Pct: 2.3 % (ref 0.4–3.1)

## 2017-10-21 MED ORDER — DILTIAZEM HCL ER COATED BEADS 180 MG PO CP24
180.0000 mg | ORAL_CAPSULE | Freq: Every day | ORAL | Status: DC
  Administered 2017-10-24 – 2017-10-28 (×4): 180 mg via ORAL
  Filled 2017-10-21 (×5): qty 1

## 2017-10-21 MED ORDER — TRAZODONE HCL 50 MG PO TABS
50.0000 mg | ORAL_TABLET | Freq: Every evening | ORAL | Status: DC | PRN
  Administered 2017-10-22 – 2017-10-27 (×4): 50 mg via ORAL
  Filled 2017-10-21 (×6): qty 1

## 2017-10-21 NOTE — Clinical Social Work Note (Signed)
Clinical Social Work Assessment  Patient Details  Name: [name]. MRN: [id number] Date of Birth: 1925-06-14  Date of referral:  10/21/17               Reason for consult:  Facility Placement                Permission sought to share information with:    Permission granted to share information::  Yes, Verbal Permission Granted  Name::        Agency::  [facility]   Relationship::  Friend/POA  Contact Information:     Housing/Transportation Living arrangements for the past 2 months:  [place] of Information:  Other (Comment Required), Friend/Neighbor(HCPOA/POA) Patient Interpreter Needed:  None Criminal Activity/Legal Involvement Pertinent to Current Situation/Hospitalization:  No - Comment as needed Significant Relationships:    Lives with:  Self Do you feel safe going back to the place where you live?  Yes Need for family participation in patient care:  Yes (Dementia, Dependent with mobility)  Care giving concerns:   The patient admitted for hip fracture and PT is recommending rehab at SNF level.   Social Worker assessment / plan:  CSW spoke with patient friend Ronalee Belts states he is the patient POA and [place]. Patient Adult children do not live in the area.  Ronalee Belts reports the patient lives at [facility]. At the facility the patient uses a wheel chair/cane with the assistance of staff. The patient needs assistance with activities of daily living.  CSW explain SNF process to [facility] and provided SNF bed offers.   CSW to contact facilities for space availability.  FL2 done.  PASRR done.   Plan: SNF  Employment status:    Insurance information:  Managed Medicare PT Recommendations:  [place] / Referral to community resources:  [place]  Patient/Family's Response to care:  Unable to assess patient.  Patient/Family's Understanding of and Emotional Response to Diagnosis, Current Treatment, and  Prognosis:  Patient POA knowledgeable of patient diagnosis and treatment.   Emotional Assessment Appearance:  Appears stated age Attitude/Demeanor/Rapport:    Affect (typically observed):    Orientation:  Oriented to Self Alcohol / Substance use:  Not Applicable Psych involvement (Current and /or in the community):  No but patient spouse died a few months and ago and patient was treated for depression. He is actively taking Abilify. Buspar, ativan, Remeron, trazodone and Zoloft.   Discharge Needs  Concerns to be addressed:  Discharge Planning Concerns Readmission within the last 30 days:  No Current discharge risk:  Dependent with Mobility Barriers to Discharge:  Continued Medical Work up, Ship broker, Programmer, applications ([place])   Lia Hopping, LCSW 10/21/2017, 3:35 PM

## 2017-10-21 NOTE — Progress Notes (Signed)
     Subjective: 1 Day Post-Op Procedure(s) (LRB): INTRAMEDULLARY (IM) NAIL INTERTROCHANTRIC (Left)   Patient expressing no significant pain with regards to the hip.  Little agitated prior to entering room, resting comfortably in the bed once entered the room.    Objective:   VITALS:   Vitals:   10/21/17 0515 10/21/17 0618  BP: 103/67 (!) 123/59  Pulse: 96 93  Resp: 16 16  Temp: 97.9 F (36.6 C) 97.7 F (36.5 C)  SpO2: 100% 100%    Dorsiflexion/Plantar flexion intact Incision: dressing C/D/I No cellulitis present Compartment soft  LABS Recent Labs    10/28/2017 0015  HGB 12.0*  HCT 36.5*  WBC 11.5*  PLT 284    Recent Labs    11/04/2017 0015 10/21/17 0556  NA 137 139  K 4.1 4.6  BUN 21* 22*  CREATININE 1.09 0.96  GLUCOSE 126* 139*     Assessment/Plan: 1 Day Post-Op Procedure(s) (LRB): INTRAMEDULLARY (IM) NAIL INTERTROCHANTRIC (Left)  Up with therapy, if able Discharge disposition TBD  Ortho recommendations:  Lovenox for 2 weeks for anticoagulation, unless other medically indicated.  Norco for pain management (Rx written).  MiraLax and Colace for constipation  Iron 325 mg tid for 2-3 weeks   WBAT on the left leg.  Dressing to remain in place until 10/23/2017.  Replace dressing with gauze and tape.  Keep the incision site clean and dry.  Follow up in 2 weeks at [facility]. Follow up with OLIN,Jhoanna Heyde D in 2 weeks.  Contact information:  [facility] [address] [place]   PAC  10/21/2017, 8:34 AM

## 2017-10-21 NOTE — NC FL2 (Signed)
[place] LEVEL OF CARE SCREENING TOOL     IDENTIFICATION  Patient Name: [name]. Birthdate: 12/03/1924 Sex: male Admission Date (Current Location): 10/10/2017  [facility] and [place] Number:  Herbalist and Address:  [facility],  [place] [address], [place]      Provider Number: [id number]  Attending Physician Name and Address:  Debbe Odea, MD  Relative Name and Phone Number:       Current Level of Care: Hospital Recommended Level of Care: [place] Prior Approval Number:    Date Approved/Denied:   PASRR Number: Pending  Discharge Plan: SNF    Current Diagnoses: Patient Active Problem List   Diagnosis Date Noted  . Closed left hip fracture ([place]) 10/21/2017  . Anemia associated with acute blood loss 10/21/2017  . Depression 10/21/2017  . Adjustment disorder with depressed mood 09/04/2017  . NORMAL PRESSURE HYDROCEPHALUS 04/12/2010  . DEPRESSION 12/15/2007  . DIABETES MELLITUS, TYPE II 05/10/2007  . HYPERLIPIDEMIA 05/10/2007  . HYPERTENSION 05/10/2007  . CORONARY ARTERY DISEASE 05/10/2007  . COPD 05/10/2007    Orientation RESPIRATION BLADDER Height & Weight     Self  Normal Continent Weight: 175 lb (79.4 kg) Height:  6' (182.9 cm)  BEHAVIORAL SYMPTOMS/MOOD NEUROLOGICAL BOWEL NUTRITION STATUS      Continent Diet(Regular)  AMBULATORY STATUS COMMUNICATION OF NEEDS Skin   Extensive Assist Verbally Skin abrasions, Other (Comment)(Surgical Incision)                       Personal Care Assistance Level of Assistance  Bathing, Feeding, Dressing Bathing Assistance: Limited assistance Feeding assistance: Independent Dressing Assistance: Limited assistance     Functional Limitations Info  Sight, Hearing, Speech Sight Info: Adequate Hearing Info: Adequate Speech Info: Adequate    SPECIAL CARE FACTORS FREQUENCY  PT (By licensed PT), OT (By licensed OT)     PT Frequency: 5X/WEEK OT  Frequency: 5X/WEEK            Contractures Contractures Info: Not present    Additional Factors Info  Code Status, Allergies, Psychotropic Code Status Info: DNR Allergies Info: Allergies: Iron, Melatonin, Mirtazapine, Sulfonamide Derivatives Psychotropic Info: Zoloft 50 mg daily for depression, Lexapro 20 mg daily for depression,Zoloft 50 mg daily for depression,Remeron 7.5 mg at bedtime for sleep, Ativan 0.5 mg BID for anxiety, and Buspar 5 mg BID for anxiety."         Current Medications (10/21/2017):  This is the current hospital active medication list Current Facility-Administered Medications  Medication Dose Route Frequency Provider Last Rate Last Dose  . 0.9 %  sodium chloride infusion   Intravenous Continuous Dorie Rank, MD 125 mL/hr at 10/31/2017 1830    . acetaminophen (TYLENOL) tablet 650 mg  650 mg Oral Q6H PRN Merton Border, MD   650 mg at 10/21/17 4315   Or  . acetaminophen (TYLENOL) suppository 650 mg  650 mg Rectal Q6H PRN Merton Border, MD      . albuterol (PROVENTIL) (2.5 MG/3ML) 0.083% nebulizer solution 2.5 mg  2.5 mg Nebulization Q6H PRN Merton Border, MD      . ARIPiprazole (ABILIFY) tablet 2.5 mg  2.5 mg Oral Daily Merton Border, MD   2.5 mg at 10/21/17 1007  . busPIRone (BUSPAR) tablet 5 mg  5 mg Oral BID Merton Border, MD   5 mg at 10/21/17 1007  . cholecalciferol (VITAMIN D) tablet 2,000 Units  2,000 Units Oral Daily Merton Border, MD  2,000 Units at 10/21/17 1007  . [START ON 10/22/2017] diltiazem (CARDIZEM CD) 24 hr capsule 180 mg  180 mg Oral Daily Rizwan, Saima, MD      . docusate sodium (COLACE) capsule 100 mg  100 mg Oral BID Danae Orleans, PA-C   100 mg at 10/21/17 1008  . enoxaparin (LOVENOX) injection 40 mg  40 mg Subcutaneous Q24H Danae Orleans, PA-C   40 mg at 10/21/17 0817  . HYDROcodone-acetaminophen (NORCO/VICODIN) 5-325 MG per tablet 1-2 tablet  1-2 tablet Oral Q4H PRN Merton Border, MD      . menthol-cetylpyridinium (CEPACOL) lozenge 3 mg  1 lozenge Oral  PRN Danae Orleans, PA-C       Or  . phenol (CHLORASEPTIC) mouth spray 1 spray  1 spray Mouth/Throat PRN Babish, Matthew, PA-C      . metoCLOPramide (REGLAN) tablet 5-10 mg  5-10 mg Oral Q8H PRN Danae Orleans, PA-C       Or  . metoCLOPramide (REGLAN) injection 5-10 mg  5-10 mg Intravenous Q8H PRN Babish, Matthew, PA-C      . mirtazapine (REMERON) tablet 7.5 mg  7.5 mg Oral QHS Merton Border, MD   7.5 mg at 11/01/2017 2252  . morphine 2 MG/ML injection 1 mg  1 mg Intravenous Q2H PRN Rise Patience, MD   1 mg at 10/15/2017 0915  . multivitamin (PROSIGHT) tablet 1 tablet  1 tablet Oral BID Merton Border, MD   1 tablet at 10/21/17 1007  . ondansetron (ZOFRAN) tablet 4 mg  4 mg Oral Q6H PRN Merton Border, MD       Or  . ondansetron (ZOFRAN) injection 4 mg  4 mg Intravenous Q6H PRN Merton Border, MD      . ondansetron (ZOFRAN-ODT) disintegrating tablet 4 mg  4 mg Oral Q6H PRN Merton Border, MD      . polyethylene glycol (MIRALAX / GLYCOLAX) packet 17 g  17 g Oral Daily PRN Babish, Matthew, PA-C      . sertraline (ZOLOFT) tablet 50 mg  50 mg Oral Daily Merton Border, MD   50 mg at 10/21/17 1008  . traZODone (DESYREL) tablet 50 mg  50 mg Oral QHS PRN Debbe Odea, MD         Discharge Medications: Please see discharge summary for a list of discharge medications.  Relevant Imaging Results:  Relevant Lab Results:   Additional Information ssn: [phone]  Lia Hopping, LCSW

## 2017-10-21 NOTE — Progress Notes (Signed)
PROGRESS NOTE    [name].   ZHG:[id number]  DOB: September 28, 1924  DOA: 10/22/2017 PCP: Lajean Manes, MD   Brief Narrative:  [name]. is a 84 y/o with advanced dementia, DM2, bladder cancer, CAD who lives in an assisted living and presents after a mechanical fall. He is found to have a left hip fracture. Underwent ORIF on 3/12  Subjective: Quite sleepy. Has no complaints. States his wife passed away about 3 months ago and that he lives alone.  ROS: no complaints of nausea, vomiting, constipation diarrhea, cough, dyspnea or dysuria. No other complaints.   Assessment & Plan:   Principal Problem:   Closed left hip fracture - s/p ORIF - plan per surgery : Lovenox for 2 wks, Iron tabs TID for 2-3 wks and f/u in 2 wks  Active Problems:   Anemia associated with acute blood loss - Hb drop from 12>>8.3 in setting of above fracture- follow- per ortho, will need oral iron on discharge  - will check anemia panel tomorrow to check Ferretin level    Depression - due to recent death of his wife? - psych note in Epic from 09/04/17 when he was evaluated for depression - Dr Mariea Clonts stated : "Continue medical medications along with Zoloft 50 mg daily for depression, Lexapro 20 mg daily for depression, Zoloft 50 mg daily for depression, Remeron 7.5 mg at bedtime for sleep, Ativan 0.5 mg BID for anxiety, and Buspar 5 mg BID for anxiety." - currently on Abilify, Buspar, Ativan, Remeron, Trazodone & Zoloft - ?If medications may be related to falls- follow mental status- d/c Ativan and continue Buspar, change Trazodone to PRN  DVT prophylaxis: Lovenox Code Status: DNR Family Communication:   Disposition Plan:  SNF recommended by PT Consultants:   ortho Procedures:    3/12- ORIF Antimicrobials:  Anti-infectives (From admission, onward)   Start     Dose/Rate Route Frequency Ordered Stop   10/22/2017 1440  ceFAZolin (ANCEF) IVPB 2g/100 mL premix  Status:  Discontinued     2 g 200  mL/hr over 30 Minutes Intravenous On call to O.R. 10/15/2017 1154 10/16/2017 1302   11/03/2017 1440  ceFAZolin (ANCEF) IVPB 2g/100 mL premix     2 g 200 mL/hr over 30 Minutes Intravenous Every 6 hours 10/28/2017 1302 11/05/2017 2254   10/12/2017 1337  ceFAZolin (ANCEF) 2-4 GM/100ML-% IVPB    Comments:  [name], [name]  : cabinet override      10/15/2017 1337 10/31/2017 1440       Objective: Vitals:   10/21/17 0209 10/21/17 0515 10/21/17 0618 10/21/17 0932  BP: (!) 109/59 103/67 (!) 123/59 (!) 106/56  Pulse: 96 96 93 97  Resp: 16 16 16 15   Temp: 97.9 F (36.6 C) 97.9 F (36.6 C) 97.7 F (36.5 C) (!) 97.5 F (36.4 C)  TempSrc: Axillary Axillary Axillary Axillary  SpO2: 97% 100% 100%   Weight:  79.4 kg (175 lb)    Height:  6' (1.829 m)      Intake/Output Summary (Last 24 hours) at 10/21/2017 1251 Last data filed at 10/21/2017 1234 Gross per 24 hour  Intake 4182.5 ml  Output 600 ml  Net 3582.5 ml   Filed Weights   10/21/17 0515  Weight: 79.4 kg (175 lb)    Examination: General exam: Appears comfortable, sitting up in a chair - quite sleepy HEENT: PERRLA, oral mucosa moist, no sclera icterus or thrush Respiratory system: Clear to auscultation. Respiratory effort normal. Cardiovascular system: S1 &  S2 heard, RRR.  No murmurs  Gastrointestinal system: Abdomen soft, non-tender, nondistended. Normal bowel sound. No organomegaly Central nervous system: Oriented to person and place but not to time. No focal neurological deficits. Extremities: No cyanosis, clubbing or edema Skin: No rashes or ulcers Psychiatry:  Mood & affect appropriate.     Data Reviewed: I have personally reviewed following labs and imaging studies  CBC: Recent Labs  Lab 10/09/2017 0015 10/21/17 0556  WBC 11.5* 12.6*  NEUTROABS 8.7*  --   HGB 12.0* 8.3*  HCT 36.5* 24.9*  MCV 92.2 92.6  PLT 284 161   Basic Metabolic Panel: Recent Labs  Lab 10/10/2017 0015 10/21/17 0556  NA 137 139  K 4.1 4.6  CL 103 108    CO2 25 25  GLUCOSE 126* 139*  BUN 21* 22*  CREATININE 1.09 0.96  CALCIUM 8.4* 8.1*   GFR: Estimated Creatinine Clearance: 56.1 mL/min (by C-G formula based on SCr of 0.96 mg/dL). Liver Function Tests: No results for input(s): AST, ALT, ALKPHOS, BILITOT, PROT, ALBUMIN in the last 168 hours. No results for input(s): LIPASE, AMYLASE in the last 168 hours. No results for input(s): AMMONIA in the last 168 hours. Coagulation Profile: Recent Labs  Lab 11/06/2017 0015  INR 1.01   Cardiac Enzymes: No results for input(s): CKTOTAL, CKMB, CKMBINDEX, TROPONINI in the last 168 hours. BNP (last 3 results) No results for input(s): PROBNP in the last 8760 hours. HbA1C: No results for input(s): HGBA1C in the last 72 hours. CBG: No results for input(s): GLUCAP in the last 168 hours. Lipid Profile: No results for input(s): CHOL, HDL, LDLCALC, TRIG, CHOLHDL, LDLDIRECT in the last 72 hours. Thyroid Function Tests: No results for input(s): TSH, T4TOTAL, FREET4, T3FREE, THYROIDAB in the last 72 hours. Anemia Panel: No results for input(s): VITAMINB12, FOLATE, FERRITIN, TIBC, IRON, RETICCTPCT in the last 72 hours. Urine analysis:    Component Value Date/Time   COLORURINE YELLOW 09/04/2017 0109   APPEARANCEUR CLEAR 09/04/2017 0109   LABSPEC 1.016 09/04/2017 0109   PHURINE 5.0 09/04/2017 0109   GLUCOSEU NEGATIVE 09/04/2017 0109   GLUCOSEU NEGATIVE 06/12/2008 1003   HGBUR NEGATIVE 09/04/2017 0109   BILIRUBINUR NEGATIVE 09/04/2017 0109   KETONESUR NEGATIVE 09/04/2017 0109   PROTEINUR NEGATIVE 09/04/2017 0109   UROBILINOGEN 1.0 03/10/2010 1456   NITRITE NEGATIVE 09/04/2017 0109   LEUKOCYTESUR SMALL (A) 09/04/2017 0109   Sepsis Labs: @LABRCNTIP (procalcitonin:4,lacticidven:4) ) Recent Results (from the past 240 hour(s))  Surgical PCR screen     Status: None   Collection Time: 10/26/2017  6:14 AM  Result Value Ref Range Status   MRSA, PCR NEGATIVE NEGATIVE Final   Staphylococcus aureus  NEGATIVE NEGATIVE Final    Comment: (NOTE) The Xpert SA Assay (FDA approved for NASAL specimens in patients 64 years of age and older), is one component of a comprehensive surveillance program. It is not intended to diagnose infection nor to guide or monitor treatment. Performed at [facility], [place] [address], [place], [place] [address]          Radiology Studies: Ct Head Wo Contrast  Result Date: 10/18/2017 CLINICAL DATA:  Left leg pain after fall from standing. Head trauma. EXAM: CT HEAD WITHOUT CONTRAST TECHNIQUE: Contiguous axial images were obtained from the base of the skull through the vertex without intravenous contrast. COMPARISON:  05/29/2011 FINDINGS: Brain: Chronic stable ventriculomegaly with shunt place from a right frontal approach. Patchy small vessel ischemic disease. No acute intracranial hemorrhage, midline shift or edema. No large  vascular territory infarct. No intra-axial mass nor extra-axial fluid collections. Vascular: No hyperdense vessel sign. Moderate atherosclerosis of carotid siphons. Skull: No fracture nor bone destruction. Sinuses/Orbits: Bilateral lens replacements. Clear mastoids. Minimal mucosal thickening along the posterior left maxillary sinus mild ethmoid sinus mucosal thickening noted as well. Other: None IMPRESSION: Ventriculomegaly with shunt in place. No acute intracranial abnormality. Chronic small vessel ischemic disease. Electronically Signed   By: Ashley Royalty M.D.   On: 10/18/2017 23:30   Dg Chest Portable 1 View  Result Date: 10/27/2017 CLINICAL DATA:  78-year-old male with hip fracture. Preop radiograph. EXAM: PORTABLE CHEST 1 VIEW COMPARISON:  Chest radiograph dated 10/28/2012 FINDINGS: Emphysema. No focal consolidation, pleural effusion, or pneumothorax. The cardiac silhouette is within normal limits. Atherosclerotic calcification of the aortic arch. Partially visualized VP shunt over the right chest. IMPRESSION: No active  disease. Electronically Signed   By: Anner Crete M.D.   On: 11/02/2017 00:41   Dg Hip Unilat With Pelvis 2-3 Views Left  Result Date: 10/09/2017 CLINICAL DATA:  Unwitnessed fall.  Hip pain. EXAM: DG HIP (WITH OR WITHOUT PELVIS) 2-3V LEFT COMPARISON:  None. FINDINGS: Acute, comminuted varus angulated intertrochanteric fracture of the left femur with avulsed lesser trochanter. No femoral head dislocation. Surgical clips project over the left hemipelvis. No pelvic fracture. Lower lumbar degenerative disc and facet arthropathy from L4 caudad. IMPRESSION: Acute, comminuted varus angulated intertrochanteric fracture of the left femur with avulsed lesser trochanter. Electronically Signed   By: Ashley Royalty M.D.   On: 11/08/2017 23:32   Dg Femur 1v Left  Result Date: 10/21/2017 CLINICAL DATA:  Fracture of the left hip EXAM: LEFT FEMUR 1 VIEW COMPARISON:  Left hip radiographs acquired sequentially. FINDINGS: AP view of the mid to distal left femur was provided in conjunction with previous same day hip radiographs. The patient has a known left intertrochanteric fracture of the left femur with avulsed lesser trochanter. The visualized femoral shaft and condyles appear intact on this single frontal view of the mid to distal femur. IMPRESSION: No fracture of the mid to distal femur nor femoral condyles. Please see the left hip radiograph report for fracture of the left intertrochanter with avulsed lesser trochanter. Electronically Signed   By: Ashley Royalty M.D.   On: 11/04/2017 23:34      Scheduled Meds: . ARIPiprazole  2.5 mg Oral Daily  . busPIRone  5 mg Oral BID  . cholecalciferol  2,000 Units Oral Daily  . diltiazem  180 mg Oral Daily  . docusate sodium  100 mg Oral BID  . enoxaparin (LOVENOX) injection  40 mg Subcutaneous Q24H  . LORazepam  0.5 mg Oral BID  . mirtazapine  7.5 mg Oral QHS  . multivitamin  1 tablet Oral BID  . sertraline  50 mg Oral Daily  . traZODone  50 mg Oral QHS   Continuous  Infusions: . sodium chloride 125 mL/hr at 10/12/2017 1830     LOS: 1 day    Time spent in minutes: 35    Debbe Odea, MD [facility] Pager: [email or web address] Password [facility] 10/21/2017, 12:51 PM

## 2017-10-21 NOTE — Evaluation (Signed)
Physical Therapy Evaluation Patient Details Name: [name]. MRN: [id number] DOB: 1925/04/15 Today's Date: 10/21/2017   History of Present Illness  84 y.o. male, nursing home resident, with history of bladder cancer, diabetes mellitus, coronary artery disease and COPD in addition to advanced dementia who was brought to the emergency room after a ground level fall and resulting left hip fracture. s/p IM nail.  Clinical Impression  Pt admitted with above diagnosis. Pt currently with functional limitations due to the deficits listed below (see PT Problem List). +2 total assist for bed mobility and transfers. SNF recommended.   Pt will benefit from skilled PT to increase their independence and safety with mobility to allow discharge to the venue listed below.       Follow Up Recommendations SNF;Supervision/Assistance - 24 hour    Equipment Recommendations  None recommended by PT    Recommendations for Other Services       Precautions / Restrictions Precautions Precautions: Fall Restrictions Weight Bearing Restrictions: No Other Position/Activity Restrictions: WBAT      Mobility  Bed Mobility Overal bed mobility: Needs Assistance Bed Mobility: Supine to Sit     Supine to sit: +2 for physical assistance;Total assist     General bed mobility comments: assist to raise trunk and advance BLEs, pt 10%  Transfers Overall transfer level: Needs assistance   Transfers: Sit to/from Stand;Stand Pivot Transfers Sit to Stand: +2 physical assistance;Total assist Stand pivot transfers: +2 physical assistance;Total assist       General transfer comment: +2 total, pt 20%, attempted to stand with RW but pt unable to come to full standing positioning so returned to sitting then did side by side 2 person pivot  Ambulation/Gait                Stairs            Wheelchair Mobility    Modified Rankin (Stroke Patients Only)       Balance Overall balance  assessment: Needs assistance Sitting-balance support: Bilateral upper extremity supported;Feet supported Sitting balance-Leahy Scale: Poor Sitting balance - Comments: poor initially requiring mod A then fair; pt sat on EOB x 8 minutes     Standing balance-Leahy Scale: Zero                               Pertinent Vitals/Pain Pain Assessment: Faces Faces Pain Scale: Hurts even more Pain Location: L hip with movement Pain Descriptors / Indicators: Moaning Pain Intervention(s): Limited activity within patient's tolerance;Monitored during session;Premedicated before session    Home Living Family/patient expects to be discharged to:: Assisted living                 Additional Comments: pt reports he ambulated with a walker (unclear if RW or rollator), pt not able to provide detailed PLOF 2* dementia    Prior Function                 Hand Dominance        Extremity/Trunk Assessment   Upper Extremity Assessment Upper Extremity Assessment: Overall WFL for tasks assessed    Lower Extremity Assessment Lower Extremity Assessment: LLE deficits/detail LLE Deficits / Details: hip flexion AAROM ~30* limited by pain, knee ext +2/5 LLE: Unable to fully assess due to pain    Cervical / Trunk Assessment Cervical / Trunk Assessment: Kyphotic(pronounced forward head)  Communication   Communication: HOH  Cognition Arousal/Alertness: Awake/alert Behavior During  Therapy: WFL for tasks assessed/performed Overall Cognitive Status: No family/caregiver present to determine baseline cognitive functioning                                 General Comments: pt oriented to self but not to location nor situation; he did recall location and situation a few minutes after being oriented to them      General Comments      Exercises General Exercises - Lower Extremity Ankle Circles/Pumps: AROM;Both;5 reps;Supine Long Arc Quad: AAROM;Left;5 reps;Seated Heel  Slides: AAROM;Left;10 reps;Supine Hip ABduction/ADduction: AAROM;Left;10 reps;Supine   Assessment/Plan    PT Assessment Patient needs continued PT services  PT Problem List Decreased strength;Decreased activity tolerance;Decreased range of motion;Decreased mobility;Decreased cognition;Pain;Decreased balance       PT Treatment Interventions Gait training;DME instruction;Therapeutic activities;Therapeutic exercise;Balance training;Functional mobility training;Patient/family education    PT Goals (Current goals can be found in the Care Plan section)  Acute Rehab PT Goals PT Goal Formulation: Patient unable to participate in goal setting Time For Goal Achievement: 11/04/17 Potential to Achieve Goals: Fair    Frequency Min 3X/week   Barriers to discharge        Co-evaluation               AM-PAC PT "6 Clicks" Daily Activity  Outcome Measure Difficulty turning over in bed (including adjusting bedclothes, sheets and blankets)?: Unable Difficulty moving from lying on back to sitting on the side of the bed? : Unable Difficulty sitting down on and standing up from a chair with arms (e.g., wheelchair, bedside commode, etc,.)?: Unable Help needed moving to and from a bed to chair (including a wheelchair)?: Total Help needed walking in hospital room?: Total Help needed climbing 3-5 steps with a railing? : Total 6 Click Score: 6    End of Session Equipment Utilized During Treatment: Gait belt Activity Tolerance: Patient limited by pain;Patient limited by fatigue Patient left: in chair;with chair alarm set;with call bell/phone within reach Nurse Communication: Need for lift equipment;Mobility status;Other (comment)(pt coughing after drinking, food residue in mouth, ? need for swallow study) PT Visit Diagnosis: Unsteadiness on feet (R26.81);History of falling (Z91.81);Difficulty in walking, not elsewhere classified (R26.2);Pain;Muscle weakness (generalized) (M62.81) Pain - Right/Left:  Left Pain - part of body: Hip    Time: 3500-9381 PT Time Calculation (min) (ACUTE ONLY): 27 min   Charges:   PT Evaluation $PT Eval Moderate Complexity: 1 Mod PT Treatments $Therapeutic Activity: 8-22 mins   PT G Codes:          Philomena Doheny 10/21/2017, 1:38 PM [phone]

## 2017-10-22 ENCOUNTER — Ambulatory Visit (INDEPENDENT_AMBULATORY_CARE_PROVIDER_SITE_OTHER): Payer: Medicare Other | Admitting: Orthopedic Surgery

## 2017-10-22 ENCOUNTER — Inpatient Hospital Stay (HOSPITAL_COMMUNITY): Payer: Medicare Other

## 2017-10-22 DIAGNOSIS — J069 Acute upper respiratory infection, unspecified: Secondary | ICD-10-CM

## 2017-10-22 DIAGNOSIS — S72002D Fracture of unspecified part of neck of left femur, subsequent encounter for closed fracture with routine healing: Secondary | ICD-10-CM

## 2017-10-22 LAB — BASIC METABOLIC PANEL
Anion gap: 6 (ref 5–15)
BUN: 25 mg/dL — ABNORMAL HIGH (ref 6–20)
CHLORIDE: 110 mmol/L (ref 101–111)
CO2: 24 mmol/L (ref 22–32)
Calcium: 7.9 mg/dL — ABNORMAL LOW (ref 8.9–10.3)
Creatinine, Ser: 0.89 mg/dL (ref 0.61–1.24)
GFR calc Af Amer: >60 mL/min (ref >60)
GLUCOSE: 149 mg/dL — AB (ref 65–99)
POTASSIUM: 4.2 mmol/L (ref 3.5–5.1)
Sodium: 140 mmol/L (ref 135–145)

## 2017-10-22 LAB — MRSA PCR SCREENING: MRSA BY PCR: NEGATIVE

## 2017-10-22 LAB — PROCALCITONIN: Procalcitonin: <0.1 ng/mL

## 2017-10-22 LAB — CBC
HEMATOCRIT: 22.6 % — AB (ref 39.0–52.0)
Hemoglobin: 7.3 g/dL — ABNORMAL LOW (ref 13.0–17.0)
MCH: 30.4 pg (ref 26.0–34.0)
MCHC: 32.3 g/dL (ref 30.0–36.0)
MCV: 94.2 fL (ref 78.0–100.0)
PLATELETS: 213 10*3/uL (ref 150–400)
RBC: 2.4 MIL/uL — AB (ref 4.22–5.81)
RDW: 15.4 % (ref 11.5–15.5)
WBC: 11.1 10*3/uL — ABNORMAL HIGH (ref 4.0–10.5)

## 2017-10-22 LAB — BRAIN NATRIURETIC PEPTIDE: B NATRIURETIC PEPTIDE 5: 547.4 pg/mL — AB (ref 0.0–100.0)

## 2017-10-22 LAB — IRON AND TIBC
Iron: 14 ug/dL — ABNORMAL LOW (ref 45–182)
SATURATION RATIOS: 7 % — AB (ref 17.9–39.5)
TIBC: 213 ug/dL — AB (ref 250–450)
UIBC: 199 ug/dL

## 2017-10-22 LAB — PREPARE RBC (CROSSMATCH)

## 2017-10-22 LAB — FOLATE: Folate: 7.2 ng/mL (ref >5.9)

## 2017-10-22 LAB — VITAMIN B12: Vitamin B-12: 269 pg/mL (ref 180–914)

## 2017-10-22 LAB — LACTIC ACID, PLASMA: Lactic Acid, Venous: 1.8 mmol/L (ref 0.5–1.9)

## 2017-10-22 MED ORDER — FUROSEMIDE 10 MG/ML IJ SOLN
40.0000 mg | Freq: Once | INTRAMUSCULAR | Status: AC
  Administered 2017-10-22: 40 mg via INTRAVENOUS
  Filled 2017-10-22: qty 4

## 2017-10-22 MED ORDER — SODIUM CHLORIDE 0.9 % IV SOLN: Freq: Once | INTRAVENOUS | Status: DC

## 2017-10-22 MED ORDER — MORPHINE SULFATE (PF) 4 MG/ML IV SOLN
1.0000 mg | INTRAVENOUS | Status: DC | PRN
  Administered 2017-10-22: 1 mg via INTRAVENOUS
  Filled 2017-10-22: qty 1

## 2017-10-22 MED ORDER — PIPERACILLIN-TAZOBACTAM 3.375 G IVPB
3.3750 g | Freq: Three times a day (TID) | INTRAVENOUS | Status: DC
  Administered 2017-10-22 – 2017-10-29 (×22): 3.375 g via INTRAVENOUS
  Filled 2017-10-22 (×22): qty 50

## 2017-10-22 MED ORDER — CYANOCOBALAMIN 1000 MCG/ML IJ SOLN
1000.0000 ug | Freq: Once | INTRAMUSCULAR | Status: AC
  Administered 2017-10-22: 1000 ug via SUBCUTANEOUS
  Filled 2017-10-22: qty 1

## 2017-10-22 NOTE — Progress Notes (Signed)
Pharmacy Antibiotic Note  Jose Ivins. is a 84 y.o. male admitted on 11/07/2017 with pneumonia.  Pharmacy has been consulted for Zosyn dosing.  Plan: Zosyn 3.375g IV Q8H infused over 4hrs. Follow up renal fxn, culture results, and clinical course.   Height: 6' (182.9 cm) Weight: 175 lb (79.4 kg) IBW/kg (Calculated) : 77.6  Temp (24hrs), Avg:98.5 F (36.9 C), Min:97.5 F (36.4 C), Max:100 F (37.8 C)  Recent Labs  Lab 10/10/2017 0015 10/21/17 0556 10/22/17 0553  WBC 11.5* 12.6* 11.1*  CREATININE 1.09 0.96 0.89    Estimated Creatinine Clearance: 60.5 mL/min (by C-G formula based on SCr of 0.89 mg/dL).    Allergies  Allergen Reactions  . Iron Hives  . Melatonin     Unknown reaction per MAR   . Mirtazapine Other (See Comments)    too drowzy  . Sulfonamide Derivatives Hives    Antimicrobials this admission: 3/12 Cefazolin x3 doses periop 3/14 Zosyn >>   Dose adjustments this admission:   Microbiology results: 3/12 MRSA PCR: negative  Thank you for allowing pharmacy to be a part of this patient's care.  Gretta Arab PharmD, BCPS Pager [phone] 10/22/2017 11:20 AM

## 2017-10-22 NOTE — Progress Notes (Addendum)
PROGRESS NOTE    Alva Ladell Pier.   BJY:[id number]  DOB: 1925-02-10  DOA: 11/03/2017 PCP: Lajean Manes, MD   Brief Narrative:  [name]. is a 84 y/o with advanced dementia, DM2, bladder cancer, CAD who lives in an assisted living and presents after a mechanical fall. He is found to have a left hip fracture. Underwent ORIF on 3/12  Subjective: Noted to be breathing rapidly today. He sounds congested and I am having difficulty in understanding what he is saying    Assessment & Plan:   Principal Problem:   Closed left hip fracture - s/p ORIF - plan per surgery : Lovenox for 2 wks, Iron tabs TID for 2-3 wks and f/u in 2 wks  Active Problems: Acute respiratory distress - noted to be on 3 L O2 - pulse ox drops to low 80s when O2 removed - he is breathing quite rapidly and has throat/ airway congestion - CXR obtained STAT shows multifocal bilateral infiltrates- ? Infection vs pulmonary edema - have started Zosyn for HCAP, possibly aspiration-  low suspicion for MRSA - BNP elevated- will give Lasix as well- he has been on IVF in the hospital- these have been stopped    Anemia associated with acute blood loss - Hb drop from 12>>8.3 >> 7.3 in setting of above fracture - ? If he is fluid overloaded and this is a dilutional drop- hold off on transfusion today- recheck later today - per ortho, will need oral iron on discharge  -   anemia panel consistent with AOCD- has a low normal B12 that can be replaced with s/c B12    Depression - due to recent death of his wife? - psych note in Epic from 09/04/17 when he was evaluated for depression - Dr Mariea Clonts stated : "Continue medical medications along with Zoloft 50 mg daily for depression, Lexapro 20 mg daily for depression, Zoloft 50 mg daily for depression, Remeron 7.5 mg at bedtime for sleep, Ativan 0.5 mg BID for anxiety, and Buspar 5 mg BID for anxiety." - currently on Abilify, Buspar, Ativan, Remeron, Trazodone & Zoloft - ?If  medications may be related to falls- follow mental status- d/c Ativan and continue Buspar, change Trazodone to PRN  H/o bladder cancer  DVT prophylaxis: Lovenox Code Status: DNR Family Communication:  Have notified POA Ross Ludwig of the patient's currently respiratory distress and the transfer to the SDU Disposition Plan:  SNF recommended by PT Consultants:   ortho Procedures:    3/12- ORIF Antimicrobials:  Anti-infectives (From admission, onward)   Start     Dose/Rate Route Frequency Ordered Stop   10/19/2017 1440  ceFAZolin (ANCEF) IVPB 2g/100 mL premix  Status:  Discontinued     2 g 200 mL/hr over 30 Minutes Intravenous On call to O.R. 10/21/2017 1154 10/28/2017 1302   10/28/2017 1440  ceFAZolin (ANCEF) IVPB 2g/100 mL premix     2 g 200 mL/hr over 30 Minutes Intravenous Every 6 hours 10/14/2017 1302 10/11/2017 2254   11/05/2017 1337  ceFAZolin (ANCEF) 2-4 GM/100ML-% IVPB    Comments:  [name], [name]  : cabinet override      11/08/2017 1337 10/26/2017 1440       Objective: Vitals:   10/21/17 0932 10/21/17 1311 10/21/17 2313 10/22/17 0519  BP: (!) 106/56 (!) 107/51 127/62 106/64  Pulse: 97 95 95 100  Resp: 15 18 18 18   Temp: (!) 97.5 F (36.4 C) 100 F (37.8 C) (!) 97.5 F (36.4 C)  97.9 F (36.6 C)  TempSrc: Axillary Axillary Axillary Axillary  SpO2:  91% 97% 97%  Weight:      Height:        Intake/Output Summary (Last 24 hours) at 10/22/2017 1024 Last data filed at 10/22/2017 0916 Gross per 24 hour  Intake 3495 ml  Output 600 ml  Net 2895 ml   Filed Weights   10/21/17 0515  Weight: 79.4 kg (175 lb)    Examination: General exam: Appears comfortable, sitting up in a chair - quite sleepy HEENT: PERRLA, oral mucosa moist, no sclera icterus or thrush Respiratory system: Clear to auscultation. Respiratory effort normal. Cardiovascular system: S1 & S2 heard, RRR.  No murmurs  Gastrointestinal system: Abdomen soft, non-tender, nondistended. Normal bowel sound. No  organomegaly Central nervous system: Oriented to person and place but not to time. No focal neurological deficits. Extremities: No cyanosis, clubbing or edema Skin: No rashes or ulcers Psychiatry:  Mood & affect appropriate.     Data Reviewed: I have personally reviewed following labs and imaging studies  CBC: Recent Labs  Lab 11/07/2017 0015 10/21/17 0556 10/22/17 0553  WBC 11.5* 12.6* 11.1*  NEUTROABS 8.7*  --   --   HGB 12.0* 8.3* 7.3*  HCT 36.5* 24.9* 22.6*  MCV 92.2 92.6 94.2  PLT 284 220 774   Basic Metabolic Panel: Recent Labs  Lab 10/28/2017 0015 10/21/17 0556 10/22/17 0553  NA 137 139 140  K 4.1 4.6 4.2  CL 103 108 110  CO2 25 25 24   GLUCOSE 126* 139* 149*  BUN 21* 22* 25*  CREATININE 1.09 0.96 0.89  CALCIUM 8.4* 8.1* 7.9*   GFR: Estimated Creatinine Clearance: 60.5 mL/min (by C-G formula based on SCr of 0.89 mg/dL). Liver Function Tests: No results for input(s): AST, ALT, ALKPHOS, BILITOT, PROT, ALBUMIN in the last 168 hours. No results for input(s): LIPASE, AMYLASE in the last 168 hours. No results for input(s): AMMONIA in the last 168 hours. Coagulation Profile: Recent Labs  Lab 10/14/2017 0015  INR 1.01   Cardiac Enzymes: No results for input(s): CKTOTAL, CKMB, CKMBINDEX, TROPONINI in the last 168 hours. BNP (last 3 results) No results for input(s): PROBNP in the last 8760 hours. HbA1C: No results for input(s): HGBA1C in the last 72 hours. CBG: No results for input(s): GLUCAP in the last 168 hours. Lipid Profile: No results for input(s): CHOL, HDL, LDLCALC, TRIG, CHOLHDL, LDLDIRECT in the last 72 hours. Thyroid Function Tests: No results for input(s): TSH, T4TOTAL, FREET4, T3FREE, THYROIDAB in the last 72 hours. Anemia Panel: Recent Labs    10/21/17 1518 10/22/17 0553  VITAMINB12  --  269  FOLATE  --  7.2  FERRITIN 76  --   TIBC  --  213*  IRON  --  14*  RETICCTPCT 2.3  --    Urine analysis:    Component Value Date/Time   COLORURINE  YELLOW 09/04/2017 0109   APPEARANCEUR CLEAR 09/04/2017 0109   LABSPEC 1.016 09/04/2017 0109   PHURINE 5.0 09/04/2017 0109   GLUCOSEU NEGATIVE 09/04/2017 0109   GLUCOSEU NEGATIVE 06/12/2008 1003   HGBUR NEGATIVE 09/04/2017 0109   BILIRUBINUR NEGATIVE 09/04/2017 0109   KETONESUR NEGATIVE 09/04/2017 0109   PROTEINUR NEGATIVE 09/04/2017 0109   UROBILINOGEN 1.0 03/10/2010 1456   NITRITE NEGATIVE 09/04/2017 0109   LEUKOCYTESUR SMALL (A) 09/04/2017 0109   Sepsis Labs: @LABRCNTIP (procalcitonin:4,lacticidven:4) ) Recent Results (from the past 240 hour(s))  Surgical PCR screen     Status: None   Collection Time: 10/22/2017  6:14 AM  Result Value Ref Range Status   MRSA, PCR NEGATIVE NEGATIVE Final   Staphylococcus aureus NEGATIVE NEGATIVE Final    Comment: (NOTE) The Xpert SA Assay (FDA approved for NASAL specimens in patients 22 years of age and older), is one component of a comprehensive surveillance program. It is not intended to diagnose infection nor to guide or monitor treatment. Performed at [facility], [place] [address], [place], [place] [address]          Radiology Studies: No results found.    Scheduled Meds: . ARIPiprazole  2.5 mg Oral Daily  . busPIRone  5 mg Oral BID  . cholecalciferol  2,000 Units Oral Daily  . diltiazem  180 mg Oral Daily  . docusate sodium  100 mg Oral BID  . enoxaparin (LOVENOX) injection  40 mg Subcutaneous Q24H  . mirtazapine  7.5 mg Oral QHS  . multivitamin  1 tablet Oral BID  . sertraline  50 mg Oral Daily   Continuous Infusions: . sodium chloride       LOS: 2 days    Time spent in minutes: 35    Debbe Odea, MD [facility] Pager: [email or web address] Password [facility] 10/22/2017, 10:24 AM

## 2017-10-22 NOTE — Progress Notes (Signed)
Placed pt on bipap 12/6 40% pt immediately pulling it off and refusing to wear it. Pt placed back on 4L nasal cannula. Notified RN of pts refusal.

## 2017-10-22 NOTE — Progress Notes (Signed)
Pt is resting, however, respiratory rate remains in the 40's.  BiPAP was attempted, but pt did not tolerate and attempted removal.  Paged MD to make her aware.  Will continue to monitor.

## 2017-10-22 NOTE — Progress Notes (Signed)
Pt has had multiple attempts at trying to get OOB.  Floor mats are in place.  Bed alarm is on medium/exiting setting. Multiple attempts have been made to reorient patient.   Will continue to monitor and reorient.

## 2017-10-22 NOTE — Progress Notes (Signed)
Patient not medically ready for transfer to SNF. SNF of choice updated. CSW will continue to follow for d/c needs.   Kathrin Greathouse, Latanya Presser, MSW Clinical Social Worker  [phone] 10/22/2017  2:45 PM

## 2017-10-23 ENCOUNTER — Inpatient Hospital Stay (HOSPITAL_COMMUNITY): Payer: Medicare Other

## 2017-10-23 DIAGNOSIS — R0603 Acute respiratory distress: Secondary | ICD-10-CM

## 2017-10-23 DIAGNOSIS — Z8781 Personal history of (healed) traumatic fracture: Secondary | ICD-10-CM

## 2017-10-23 LAB — CBC
HCT: 23 % — ABNORMAL LOW (ref 39.0–52.0)
Hemoglobin: 7.6 g/dL — ABNORMAL LOW (ref 13.0–17.0)
MCH: 31 pg (ref 26.0–34.0)
MCHC: 33 g/dL (ref 30.0–36.0)
MCV: 93.9 fL (ref 78.0–100.0)
PLATELETS: 265 10*3/uL (ref 150–400)
RBC: 2.45 MIL/uL — AB (ref 4.22–5.81)
RDW: 15.5 % (ref 11.5–15.5)
WBC: 15.7 10*3/uL — AB (ref 4.0–10.5)

## 2017-10-23 LAB — BASIC METABOLIC PANEL
ANION GAP: 11 (ref 5–15)
BUN: 30 mg/dL — ABNORMAL HIGH (ref 6–20)
CALCIUM: 8 mg/dL — AB (ref 8.9–10.3)
CO2: 23 mmol/L (ref 22–32)
Chloride: 107 mmol/L (ref 101–111)
Creatinine, Ser: 1.01 mg/dL (ref 0.61–1.24)
GLUCOSE: 127 mg/dL — AB (ref 65–99)
Potassium: 3.9 mmol/L (ref 3.5–5.1)
SODIUM: 141 mmol/L (ref 135–145)

## 2017-10-23 LAB — PREPARE RBC (CROSSMATCH)

## 2017-10-23 LAB — BRAIN NATRIURETIC PEPTIDE: B NATRIURETIC PEPTIDE 5: 846.5 pg/mL — AB (ref 0.0–100.0)

## 2017-10-23 LAB — HEMOGLOBIN AND HEMATOCRIT, BLOOD
HCT: 29.5 % — ABNORMAL LOW (ref 39.0–52.0)
HEMOGLOBIN: 9.4 g/dL — AB (ref 13.0–17.0)

## 2017-10-23 LAB — PROCALCITONIN: PROCALCITONIN: 0.22 ng/mL

## 2017-10-23 MED ORDER — ORAL CARE MOUTH RINSE
15.0000 mL | Freq: Two times a day (BID) | OROMUCOSAL | Status: DC
  Administered 2017-10-23 – 2017-10-29 (×11): 15 mL via OROMUCOSAL

## 2017-10-23 MED ORDER — FUROSEMIDE 10 MG/ML IJ SOLN
40.0000 mg | Freq: Once | INTRAMUSCULAR | Status: AC
  Administered 2017-10-23: 40 mg via INTRAVENOUS
  Filled 2017-10-23: qty 4

## 2017-10-23 MED ORDER — MORPHINE SULFATE (PF) 4 MG/ML IV SOLN
1.0000 mg | INTRAVENOUS | Status: DC | PRN
Start: 2017-10-23 — End: 2017-10-29
  Administered 2017-10-25 – 2017-10-29 (×6): 1 mg via INTRAVENOUS
  Filled 2017-10-23 (×6): qty 1

## 2017-10-23 MED ORDER — FUROSEMIDE 10 MG/ML IJ SOLN: 20.0000 mg | Freq: Once | INTRAMUSCULAR | Status: DC

## 2017-10-23 MED ORDER — SODIUM CHLORIDE 0.9 % IV SOLN
Freq: Once | INTRAVENOUS | Status: AC
  Administered 2017-10-23: 16:00:00 via INTRAVENOUS

## 2017-10-23 NOTE — Progress Notes (Addendum)
PROGRESS NOTE    [name].   EGB:[id number]  DOB: [birth year]  DOA: 10/30/2017 PCP: Lajean Manes, MD   Brief Narrative:  [name]. is a 84 y/o with advanced dementia, DM2, bladder cancer, CAD who lives in an assisted living and presents after a mechanical fall. He is found to have a left hip fracture. Underwent ORIF on 3/12  Subjective: Breathing less rapid today. Alert and communicative.  Pulse ox drops to high 80s when I checked him without O2 today. RNs held some of his meds last night. He has no complaints.     Assessment & Plan:   Principal Problem:   Closed left hip fracture - s/p ORIF - plan per surgery : Lovenox for 2 wks, Iron tabs TID for 2-3 wks and f/u in 2 wks  Active Problems: Acute respiratory distress on 3/14 - on AM of 3/14 -  noted to be on 3 L O2 - pulse ox drops to low 80s when O2 removed - breathing quite rapidly and has throat/ airway congestion - CXR obtained STAT shows multifocal bilateral infiltrates- ? Infection vs pulmonary edema - have started Zosyn (on 3/14) for HCAP, possibly aspiration-  low suspicion for MRSA - BNP elevated-   given 40 mg Lasix as well- more than 1 L output- - keep foley for accurate I and O - BNP high and Procalcitonin negative - repeat CXR today shows bibasilar opacities-  will give another dose of lasix today since he will get blood -   he was on IVF earlier on in the hospital- these have been stopped    Anemia associated with acute blood loss - Hb drop from 12>>8.3 >> 7.3>> 7.6 in setting of above fracture - ? If he is fluid overloaded and this is a dilutional drop - will transfuse 1 U PRBC today   - per ortho, will need oral iron on discharge  -   anemia panel consistent with AOCD- has a low normal B12 - given a dose of  s/c B12 1000 mcg    Depression - due to recent death of his wife? - psych note in Epic from 09/04/17 when he was evaluated for depression - Dr Mariea Clonts stated : "Continue medical  medications along with Zoloft 50 mg daily for depression, Lexapro 20 mg daily for depression, Zoloft 50 mg daily for depression, Remeron 7.5 mg at bedtime for sleep, Ativan 0.5 mg BID for anxiety, and Buspar 5 mg BID for anxiety." - currently on Abilify, Buspar, Ativan, Remeron, Trazodone & Zoloft - ?If medications may be related to falls- follow mental status- d/c Ativan and continue Buspar to prevent benzo withdrawal - changed Trazodone to PRN only   HTN - BP low today- holding parameters on Cardizem  H/o bladder cancer  DVT prophylaxis: Lovenox Code Status: DNR Family Communication:  On 3/14- notified POA Ross Ludwig of the patient's currently respiratory distress and the transfer to the SDU Disposition Plan:  SNF recommended by PT Consultants:   ortho Procedures:    3/12- ORIF Antimicrobials:  Anti-infectives (From admission, onward)   Start     Dose/Rate Route Frequency Ordered Stop   10/22/17 1200  piperacillin-tazobactam (ZOSYN) IVPB 3.375 g     3.375 g 12.5 mL/hr over 240 Minutes Intravenous Every 8 hours 10/22/17 1121     10/12/2017 1440  ceFAZolin (ANCEF) IVPB 2g/100 mL premix  Status:  Discontinued     2 g 200 mL/hr over 30 Minutes Intravenous On call  to O.R. 10/09/2017 1154 10/09/2017 1302   10/16/2017 1440  ceFAZolin (ANCEF) IVPB 2g/100 mL premix     2 g 200 mL/hr over 30 Minutes Intravenous Every 6 hours 11/06/2017 1302 10/09/2017 2254   10/27/2017 1337  ceFAZolin (ANCEF) 2-4 GM/100ML-% IVPB    Comments:  [name], [name]  : cabinet override      10/12/2017 1337 11/06/2017 1440       Objective: Vitals:   10/23/17 0748 10/23/17 0800 10/23/17 1136 10/23/17 1200  BP:  (!) 114/55  124/83  Pulse:      Resp:  (!) 34  (!) 41  Temp: (!) 97 F (36.1 C)  98.1 F (36.7 C)   TempSrc: Axillary  Axillary   SpO2:  95%  93%  Weight:      Height:        Intake/Output Summary (Last 24 hours) at 10/23/2017 1407 Last data filed at 10/23/2017 0600 Gross per 24 hour  Intake 150 ml    Output 1550 ml  Net -1400 ml   Filed Weights   10/21/17 0515  Weight: 79.4 kg (175 lb)    Examination: General exam: Appears comfortable, sitting up in bed HEENT: PERRLA, oral mucosa moist, no sclera icterus or thrush Respiratory system: very poor air entry- mostly clear lungs- pulse ox 86% on room air Cardiovascular system: S1 & S2 heard, RRR.  No murmurs  Gastrointestinal system: Abdomen soft, non-tender, nondistended. Normal bowel sound. No organomegaly Central nervous system: Oriented to person and place but not to time. No focal neurological deficits. Extremities: No cyanosis, clubbing or edema Skin: No rashes or ulcers Psychiatry:  Mood & affect appropriate.     Data Reviewed: I have personally reviewed following labs and imaging studies  CBC: Recent Labs  Lab 10/27/2017 0015 10/21/17 0556 10/22/17 0553 10/23/17 0324  WBC 11.5* 12.6* 11.1* 15.7*  NEUTROABS 8.7*  --   --   --   HGB 12.0* 8.3* 7.3* 7.6*  HCT 36.5* 24.9* 22.6* 23.0*  MCV 92.2 92.6 94.2 93.9  PLT 284 220 213 161   Basic Metabolic Panel: Recent Labs  Lab 10/19/2017 0015 10/21/17 0556 10/22/17 0553 10/23/17 0324  NA 137 139 140 141  K 4.1 4.6 4.2 3.9  CL 103 108 110 107  CO2 25 25 24 23   GLUCOSE 126* 139* 149* 127*  BUN 21* 22* 25* 30*  CREATININE 1.09 0.96 0.89 1.01  CALCIUM 8.4* 8.1* 7.9* 8.0*   GFR: Estimated Creatinine Clearance: 53.4 mL/min (by C-G formula based on SCr of 1.01 mg/dL). Liver Function Tests: No results for input(s): AST, ALT, ALKPHOS, BILITOT, PROT, ALBUMIN in the last 168 hours. No results for input(s): LIPASE, AMYLASE in the last 168 hours. No results for input(s): AMMONIA in the last 168 hours. Coagulation Profile: Recent Labs  Lab 10/09/2017 0015  INR 1.01   Cardiac Enzymes: No results for input(s): CKTOTAL, CKMB, CKMBINDEX, TROPONINI in the last 168 hours. BNP (last 3 results) No results for input(s): PROBNP in the last 8760 hours. HbA1C: No results for  input(s): HGBA1C in the last 72 hours. CBG: No results for input(s): GLUCAP in the last 168 hours. Lipid Profile: No results for input(s): CHOL, HDL, LDLCALC, TRIG, CHOLHDL, LDLDIRECT in the last 72 hours. Thyroid Function Tests: No results for input(s): TSH, T4TOTAL, FREET4, T3FREE, THYROIDAB in the last 72 hours. Anemia Panel: Recent Labs    10/21/17 1518 10/22/17 0553  VITAMINB12  --  269  FOLATE  --  7.2  FERRITIN 76  --  TIBC  --  213*  IRON  --  14*  RETICCTPCT 2.3  --    Urine analysis:    Component Value Date/Time   COLORURINE YELLOW 09/04/2017 0109   APPEARANCEUR CLEAR 09/04/2017 0109   LABSPEC 1.016 09/04/2017 0109   PHURINE 5.0 09/04/2017 0109   GLUCOSEU NEGATIVE 09/04/2017 0109   GLUCOSEU NEGATIVE 06/12/2008 1003   HGBUR NEGATIVE 09/04/2017 0109   BILIRUBINUR NEGATIVE 09/04/2017 0109   KETONESUR NEGATIVE 09/04/2017 0109   PROTEINUR NEGATIVE 09/04/2017 0109   UROBILINOGEN 1.0 03/10/2010 1456   NITRITE NEGATIVE 09/04/2017 0109   LEUKOCYTESUR SMALL (A) 09/04/2017 0109   Sepsis Labs: @LABRCNTIP (procalcitonin:4,lacticidven:4) ) Recent Results (from the past 240 hour(s))  Surgical PCR screen     Status: None   Collection Time: 10/13/2017  6:14 AM  Result Value Ref Range Status   MRSA, PCR NEGATIVE NEGATIVE Final   Staphylococcus aureus NEGATIVE NEGATIVE Final    Comment: (NOTE) The Xpert SA Assay (FDA approved for NASAL specimens in patients 61 years of age and older), is one component of a comprehensive surveillance program. It is not intended to diagnose infection nor to guide or monitor treatment. Performed at [facility], [place] [address] [place], [place] [address]   MRSA PCR Screening     Status: None   Collection Time: 10/22/17  3:20 PM  Result Value Ref Range Status   MRSA by PCR NEGATIVE NEGATIVE Final    Comment:        The GeneXpert MRSA Assay (FDA approved for NASAL specimens only), is one component of a comprehensive  MRSA colonization surveillance program. It is not intended to diagnose MRSA infection nor to guide or monitor treatment for MRSA infections. Performed at [facility], [place] [address], [place], [place] [address]          Radiology Studies: Dg Chest Port 1 View  Result Date: 10/23/2017 CLINICAL DATA:  Hypoxia EXAM: PORTABLE CHEST 1 VIEW COMPARISON:  October 22, 2017 FINDINGS: Areas of focal airspace consolidation in both lung bases are again noted, concerning for focal pneumonia. There is a small left pleural effusion. Heart is upper normal in size. Pulmonary vascularity is within normal limits. No adenopathy. There is aortic atherosclerosis. No bone lesions. A shunt catheter extends along the right hemithorax. IMPRESSION: Patchy bibasilar infiltrate concerning for pneumonia. Small left pleural effusion. Lungs elsewhere clear. Stable cardiac silhouette. There is aortic atherosclerosis. Aortic Atherosclerosis (ICD10-I70.0). Electronically Signed   By: Lowella Grip III M.D.   On: 10/23/2017 09:01   Dg Chest Port 1 View  Result Date: 10/22/2017 CLINICAL DATA:  Tachypnea, shortness of breath EXAM: PORTABLE CHEST 1 VIEW COMPARISON:  Chest x-ray of 10/20/2016 FINDINGS: There are parenchymal opacities at both lung bases suspicious for multifocal pneumonia. No definite pleural effusion is seen although small effusions cannot be excluded. Mediastinal and hilar contours are unremarkable. The heart is borderline enlarged and stable. No bony abnormality is seen. IMPRESSION: Bibasilar parenchymal opacities suspicious for multifocal pneumonia. Recommend follow-up with two-view chest x-ray. Electronically Signed   By: Ivar Drape M.D.   On: 10/22/2017 10:23      Scheduled Meds: . ARIPiprazole  2.5 mg Oral Daily  . busPIRone  5 mg Oral BID  . cholecalciferol  2,000 Units Oral Daily  . diltiazem  180 mg Oral Daily  . docusate sodium  100 mg Oral BID  . enoxaparin (LOVENOX)  injection  40 mg Subcutaneous Q24H  . furosemide  40 mg Intravenous Once  .  mouth rinse  15 mL Mouth Rinse BID  . mirtazapine  7.5 mg Oral QHS  . multivitamin  1 tablet Oral BID  . sertraline  50 mg Oral Daily   Continuous Infusions: . sodium chloride    . sodium chloride    . piperacillin-tazobactam (ZOSYN)  IV 3.375 g (10/23/17 1116)     LOS: 3 days    Time spent in minutes: 35    Debbe Odea, MD [facility] Pager: [email or web address] Password [facility] 10/23/2017, 2:07 PM

## 2017-10-23 NOTE — Progress Notes (Signed)
PHARMACY NOTE -  Helen has been assisting with dosing of Zosyn for PNA. Dosage remains stable at Zosyn 3.375gm IV Q8h to be infused over 4hrs and need for further dosage adjustment appears unlikely at present.    Will sign off at this time.  Pharmacy will monitor peripherally via electronic surveillance software for renal function changes & + cx data.  Please reconsult if a change in clinical status warrants re-evaluation of dosage. Netta Cedars, PharmD, BCPS Pager: [phone] 10/23/2017@8 :42 AM

## 2017-10-23 NOTE — Progress Notes (Signed)
Physical Therapy Treatment Patient Details Name: [name]. MRN: [id number] DOB: [birth year] Today's Date: 10/23/2017    History of Present Illness 84 y.o. male, nursing home resident, with history of bladder cancer, diabetes mellitus, coronary artery disease and COPD in addition to advanced dementia who was brought to the emergency room after a ground level fall and resulting left hip fracture. s/p IM nail.    PT Comments    +2 total assist for bed mobility and for transfers. SaO2 94% on 5L O2 with activity, but pt had elevated respiratory rate of 36. Performed AAROM to LLE, pt able to participate with exercises.   Follow Up Recommendations  SNF;Supervision/Assistance - 24 hour     Equipment Recommendations  None recommended by PT    Recommendations for Other Services       Precautions / Restrictions Precautions Precautions: Fall Restrictions Weight Bearing Restrictions: No Other Position/Activity Restrictions: WBAT    Mobility  Bed Mobility Overal bed mobility: Needs Assistance Bed Mobility: Supine to Sit     Supine to sit: +2 for physical assistance;Total assist     General bed mobility comments: assist to raise trunk and advance BLEs, pt 10%  Transfers Overall transfer level: Needs assistance   Transfers: Sit to/from Stand;Stand Pivot Transfers Sit to Stand: +2 physical assistance;Total assist Stand pivot transfers: +2 physical assistance;Total assist       General transfer comment: +2 total, pt 20%, sit to stand x 4 trials, SPT x 2 (bed to 3 in 1, then to recliner)  Ambulation/Gait             General Gait Details: unable   Stairs            Wheelchair Mobility    Modified Rankin (Stroke Patients Only)       Balance Overall balance assessment: Needs assistance Sitting-balance support: Bilateral upper extremity supported;Feet supported Sitting balance-Leahy Scale: Poor Sitting balance - Comments: poor initially requiring mod A  then fair; pt sat on EOB x 5 minutes, trunk very flexed with  pronounced forward head, pt able to partially lift head with verbal/manual cues     Standing balance-Leahy Scale: Zero                              Cognition Arousal/Alertness: Awake/alert Behavior During Therapy: WFL for tasks assessed/performed Overall Cognitive Status: No family/caregiver present to determine baseline cognitive functioning                                 General Comments: pt oriented to self  and to location not situation, can follow 1 step commands      Exercises General Exercises - Lower Extremity Ankle Circles/Pumps: AROM;Both;5 reps;Supine Long Arc Quad: AAROM;Left;5 reps;Seated Heel Slides: AAROM;Left;10 reps;Supine Hip ABduction/ADduction: AAROM;Left;10 reps;Supine    General Comments        Pertinent Vitals/Pain Faces Pain Scale: Hurts even more Pain Location: L hip with movement Pain Descriptors / Indicators: Grimacing;Guarding Pain Intervention(s): Limited activity within patient's tolerance;Monitored during session;Repositioned    Home Living                      Prior Function            PT Goals (current goals can now be found in the care plan section) Acute Rehab PT Goals PT Goal Formulation: Patient unable  to participate in goal setting Time For Goal Achievement: 11/04/17 Potential to Achieve Goals: Fair Progress towards PT goals: Progressing toward goals    Frequency    Min 3X/week      PT Plan Current plan remains appropriate    Co-evaluation              AM-PAC PT "6 Clicks" Daily Activity  Outcome Measure  Difficulty turning over in bed (including adjusting bedclothes, sheets and blankets)?: Unable Difficulty moving from lying on back to sitting on the side of the bed? : Unable Difficulty sitting down on and standing up from a chair with arms (e.g., wheelchair, bedside commode, etc,.)?: Unable Help needed moving to  and from a bed to chair (including a wheelchair)?: Total Help needed walking in hospital room?: Total Help needed climbing 3-5 steps with a railing? : Total 6 Click Score: 6    End of Session Equipment Utilized During Treatment: Gait belt Activity Tolerance: Patient limited by pain;Patient limited by fatigue Patient left: in chair;with chair alarm set;with call bell/phone within reach Nurse Communication: Need for lift equipment;Mobility status PT Visit Diagnosis: Unsteadiness on feet (R26.81);History of falling (Z91.81);Difficulty in walking, not elsewhere classified (R26.2);Pain;Muscle weakness (generalized) (M62.81) Pain - Right/Left: Left Pain - part of body: Hip     Time: 2440-1027 PT Time Calculation (min) (ACUTE ONLY): 35 min  Charges:  $Therapeutic Exercise: 8-22 mins $Therapeutic Activity: 8-22 mins                    G Codes:          Blondell Reveal Kistler 10/23/2017, 2:25 PM [phone]

## 2017-10-24 ENCOUNTER — Inpatient Hospital Stay (HOSPITAL_COMMUNITY): Payer: Medicare Other

## 2017-10-24 LAB — BPAM RBC
BLOOD PRODUCT EXPIRATION DATE: 201904042359
ISSUE DATE / TIME: 201903151642
Unit Type and Rh: 5100

## 2017-10-24 LAB — BASIC METABOLIC PANEL
Anion gap: 12 (ref 5–15)
BUN: 37 mg/dL — ABNORMAL HIGH (ref 6–20)
CO2: 26 mmol/L (ref 22–32)
Calcium: 8 mg/dL — ABNORMAL LOW (ref 8.9–10.3)
Chloride: 106 mmol/L (ref 101–111)
Creatinine, Ser: 1.14 mg/dL (ref 0.61–1.24)
GFR calc Af Amer: >60 mL/min (ref >60)
GFR, EST NON AFRICAN AMERICAN: 55 mL/min — AB (ref >60)
Glucose, Bld: 147 mg/dL — ABNORMAL HIGH (ref 65–99)
POTASSIUM: 3.2 mmol/L — AB (ref 3.5–5.1)
SODIUM: 144 mmol/L (ref 135–145)

## 2017-10-24 LAB — TYPE AND SCREEN
ABO/RH(D): O POS
ANTIBODY SCREEN: NEGATIVE
Unit division: 0

## 2017-10-24 LAB — CBC
HEMATOCRIT: 27.1 % — AB (ref 39.0–52.0)
Hemoglobin: 8.9 g/dL — ABNORMAL LOW (ref 13.0–17.0)
MCH: 29.9 pg (ref 26.0–34.0)
MCHC: 32.8 g/dL (ref 30.0–36.0)
MCV: 90.9 fL (ref 78.0–100.0)
Platelets: 270 10*3/uL (ref 150–400)
RBC: 2.98 MIL/uL — AB (ref 4.22–5.81)
RDW: 16.5 % — ABNORMAL HIGH (ref 11.5–15.5)
WBC: 17 10*3/uL — AB (ref 4.0–10.5)

## 2017-10-24 LAB — ECHOCARDIOGRAM COMPLETE
Height: 72 in
Weight: 2557.34 oz

## 2017-10-24 LAB — BRAIN NATRIURETIC PEPTIDE: B Natriuretic Peptide: 1504.1 pg/mL — ABNORMAL HIGH (ref 0.0–100.0)

## 2017-10-24 MED ORDER — PERFLUTREN LIPID MICROSPHERE
1.0000 mL | INTRAVENOUS | Status: AC | PRN
  Administered 2017-10-24: 4 mL via INTRAVENOUS
  Filled 2017-10-24: qty 10

## 2017-10-24 MED ORDER — POTASSIUM CHLORIDE 10 MEQ/100ML IV SOLN
10.0000 meq | INTRAVENOUS | Status: AC
  Administered 2017-10-24 (×3): 10 meq via INTRAVENOUS
  Filled 2017-10-24 (×3): qty 100

## 2017-10-24 MED ORDER — IPRATROPIUM-ALBUTEROL 0.5-2.5 (3) MG/3ML IN SOLN: 3.0000 mL | RESPIRATORY_TRACT | Status: DC | PRN

## 2017-10-24 NOTE — Progress Notes (Signed)
RN paged MD to notify of patient's lethargic state and increased difficulty to swallow, as compared to 10/23/17. MD ordered SLP eval.   Will continue to monitor.

## 2017-10-24 NOTE — Progress Notes (Signed)
Patient ID: Jose Barman., male   DOB: [birth year], 84 y.o.   MRN: [id number]  PROGRESS NOTE    Jose Ladell Pier.  QTM:[id number] DOB: 22-Jul-1925 DOA: 10/24/2017  PCP: Lajean Manes, MD   Brief Narrative:  84 year old male with advanced dementia, DM2, bladder cancer, CAD who lives in an assisted living. Pt presented to ED after a mechanical fall. He was found to have a left hip fracture. Underwent ORIF on 3/12  Assessment & Plan:   Principal Problem:   Closed left hip fracture ([place]) - S/P ORIF 10/27/2017 - Per surgery, Lovenox for 2 weeks, iron tabs TID for 2-3 weeks and f/u in 2 weeks  Active Problems:   Leukocytosis / Multifocal pneumonia / Acute respiratory failure with hypoxia - Continue zosyn - CXR concerning for multifocal pneumonia - Follow up CBC in am    Anemia associated with acute blood loss - Hgb stable at 8.9 - S/P 1 U pRBC transfusion 3/15    Hypokalemia - Supplemented     Essential hypertension - Continue Cardizem 180 mg daily     Anxiety and depression - Continue Abilify, Buspar, Zoloft  - Continue Remeron     DVT prophylaxis: Lovenox subQ Code Status: DNR/DNI  Family Communication: daughter at the bedside  Disposition Plan: remains in SDU   Consultants:   Ortho  Procedures:   ORIF 3/12  1 U PRBC 3/15  Antimicrobials:   Zosyn -->   Subjective: No overnight events.   Objective: Vitals:   10/24/17 1600 10/24/17 1800 10/24/17 1930 10/24/17 2000  BP: 115/74 (!) 124/57  (!) 115/59  Pulse:      Resp: (!) 45 (!) 43  (!) 37  Temp:   97.6 F (36.4 C)   TempSrc:   Axillary   SpO2: 100% 99%  98%  Weight:      Height:        Intake/Output Summary (Last 24 hours) at 10/24/2017 2159 Last data filed at 10/24/2017 1900 Gross per 24 hour  Intake 460 ml  Output 700 ml  Net -240 ml   Filed Weights   10/21/17 0515 10/24/17 0334 10/24/17 0700  Weight: 79.4 kg (175 lb) 74.1 kg (163 lb 5.8 oz) 72.5 kg (159 lb 13.3 oz)     Examination:  General exam: Appears calm and comfortable  Respiratory system: Diminished brewath sounds, no wheezing  Cardiovascular system: S1 & S2 heard, Rate controlled  Gastrointestinal system: Abdomen is nondistended, soft and nontender. No organomegaly or masses felt. Normal bowel sounds heard. Central nervous system: Sleeping, mittens (+) Extremities: Symmetric 5 x 5 power. Skin: No rashes, lesions or ulcers Psychiatry: Normal mood and behavior   Data Reviewed: I have personally reviewed following labs and imaging studies  CBC: Recent Labs  Lab 11/04/2017 0015 10/21/17 0556 10/22/17 0553 10/23/17 0324 10/23/17 2053 10/24/17 0328  WBC 11.5* 12.6* 11.1* 15.7*  --  17.0*  NEUTROABS 8.7*  --   --   --   --   --   HGB 12.0* 8.3* 7.3* 7.6* 9.4* 8.9*  HCT 36.5* 24.9* 22.6* 23.0* 29.5* 27.1*  MCV 92.2 92.6 94.2 93.9  --  90.9  PLT 284 220 213 265  --  625   Basic Metabolic Panel: Recent Labs  Lab 11/03/2017 0015 10/21/17 0556 10/22/17 0553 10/23/17 0324 10/24/17 0328  NA 137 139 140 141 144  K 4.1 4.6 4.2 3.9 3.2*  CL 103 108 110 107 106  CO2 25 25 24  23  26  GLUCOSE 126* 139* 149* 127* 147*  BUN 21* 22* 25* 30* 37*  CREATININE 1.09 0.96 0.89 1.01 1.14  CALCIUM 8.4* 8.1* 7.9* 8.0* 8.0*   GFR: Estimated Creatinine Clearance: 44.2 mL/min (by C-G formula based on SCr of 1.14 mg/dL). Liver Function Tests: No results for input(s): AST, ALT, ALKPHOS, BILITOT, PROT, ALBUMIN in the last 168 hours. No results for input(s): LIPASE, AMYLASE in the last 168 hours. No results for input(s): AMMONIA in the last 168 hours. Coagulation Profile: Recent Labs  Lab 10/25/2017 0015  INR 1.01   Cardiac Enzymes: No results for input(s): CKTOTAL, CKMB, CKMBINDEX, TROPONINI in the last 168 hours. BNP (last 3 results) No results for input(s): PROBNP in the last 8760 hours. HbA1C: No results for input(s): HGBA1C in the last 72 hours. CBG: No results for input(s): GLUCAP in the last  168 hours. Lipid Profile: No results for input(s): CHOL, HDL, LDLCALC, TRIG, CHOLHDL, LDLDIRECT in the last 72 hours. Thyroid Function Tests: No results for input(s): TSH, T4TOTAL, FREET4, T3FREE, THYROIDAB in the last 72 hours. Anemia Panel: Recent Labs    10/22/17 0553  VITAMINB12 269  FOLATE 7.2  TIBC 213*  IRON 14*    Sepsis Labs: @LABRCNTIP (procalcitonin:4,lacticidven:4)   Surgical PCR screen     Status: None   Collection Time: 10/09/2017  6:14 AM  Result Value Ref Range Status   MRSA, PCR NEGATIVE NEGATIVE Final   Staphylococcus aureus NEGATIVE NEGATIVE Final  MRSA PCR Screening     Status: None   Collection Time: 10/22/17  3:20 PM  Result Value Ref Range Status   MRSA by PCR NEGATIVE NEGATIVE Final      Radiology Studies: Dg Chest Port 1 View Result Date: 10/23/2017 Patchy bibasilar infiltrate concerning for pneumonia. Small left pleural effusion. Lungs elsewhere clear. Stable cardiac silhouette.   Dg Chest Port 1 View Result Date: 10/22/2017 Bibasilar parenchymal opacities suspicious for multifocal pneumonia. Recommend follow-up with two-view chest x-ray.    Scheduled Meds: . ARIPiprazole  2.5 mg Oral Daily  . busPIRone  5 mg Oral BID  . cholecalciferol  2,000 Units Oral Daily  . diltiazem  180 mg Oral Daily  . docusate sodium  100 mg Oral BID  . enoxaparin  40 mg Subcutaneous Q24H  . mirtazapine  7.5 mg Oral QHS  . multivitamin  1 tablet Oral BID  . sertraline  50 mg Oral Daily   Continuous Infusions: . piperacillin-tazobactam (ZOSYN)  IV 3.375 g (10/24/17 2035)     LOS: 4 days    Time spent: 25 minutes Greater than 50% of the time spent on counseling and coordinating the care.   Leisa Lenz, MD [facility] Pager [phone]  If 7PM-7AM, please contact night-coverage [email or web address] Password [facility] 10/24/2017, 9:59 PM

## 2017-10-24 NOTE — Progress Notes (Signed)
SLP Cancellation Note  Patient Details Name: [name]. MRN: [id number] DOB: June 04, 1925   Cancelled treatment:       Reason Eval/Treat Not Completed: Patient's level of consciousness;Fatigue/lethargy limiting ability to participate. SLP spoke with patient's POA and with his RN. RN stated that patient is more lethargic today as compared to yesterday. Patient was in bed sleeping and was not able to be aroused.    Sonia Baller, MA, CCC-SLP 10/24/17 3:58 PM

## 2017-10-24 NOTE — Progress Notes (Signed)
  Echocardiogram 2D Echocardiogram has been performed.  Allix Blomquist T Haniel Fix 10/24/2017, 11:31 AM

## 2017-10-25 LAB — BASIC METABOLIC PANEL
Anion gap: 10 (ref 5–15)
BUN: 39 mg/dL — AB (ref 6–20)
CHLORIDE: 108 mmol/L (ref 101–111)
CO2: 25 mmol/L (ref 22–32)
Calcium: 7.8 mg/dL — ABNORMAL LOW (ref 8.9–10.3)
Creatinine, Ser: 1.05 mg/dL (ref 0.61–1.24)
GFR calc Af Amer: >60 mL/min (ref >60)
GLUCOSE: 140 mg/dL — AB (ref 65–99)
POTASSIUM: 3.3 mmol/L — AB (ref 3.5–5.1)
Sodium: 143 mmol/L (ref 135–145)

## 2017-10-25 LAB — CBC
HEMATOCRIT: 26.1 % — AB (ref 39.0–52.0)
Hemoglobin: 8.6 g/dL — ABNORMAL LOW (ref 13.0–17.0)
MCH: 30.1 pg (ref 26.0–34.0)
MCHC: 33 g/dL (ref 30.0–36.0)
MCV: 91.3 fL (ref 78.0–100.0)
Platelets: 281 10*3/uL (ref 150–400)
RBC: 2.86 MIL/uL — ABNORMAL LOW (ref 4.22–5.81)
RDW: 16.4 % — AB (ref 11.5–15.5)
WBC: 13.8 10*3/uL — ABNORMAL HIGH (ref 4.0–10.5)

## 2017-10-25 NOTE — Social Work (Addendum)
Patient not medically ready for SNF.  CSW will continue to follow for placement at appropriate time.  1:08pm CSW received call from [place], Therapist, sports at [facility]. CSW advised that is not medically stable yet and will not return to [facility] at discharge as he will need SNF. She is in agreement with same. CSW indicated that we will assist patient with discharge plan at appropriate time.    CSW will continue to follow.  Elissa Hefty, [place] Clinical Social Worker-[facility] [place] [phone]

## 2017-10-25 NOTE — Evaluation (Signed)
Clinical/Bedside Swallow Evaluation Patient Details  Name: [name]. MRN: [id number] Date of Birth: [birth year]  Today's Date: 10/25/2017 Time: SLP Start Time (ACUTE ONLY): 1335 SLP Stop Time (ACUTE ONLY): 1350 SLP Time Calculation (min) (ACUTE ONLY): 15 min  Past Medical History:  Past Medical History:  Diagnosis Date  . Adenomatous polyps 2003, 2006, 2007  . Bladder cancer ([place])   . CAD (coronary artery disease)   . COPD (chronic obstructive pulmonary disease) ([place])   . Depression   . Diverticulosis 07/2006  . DM (diabetes mellitus) ([place])   . Hemorrhoids, external   . Hemorrhoids, internal   . History of prostate cancer   . HLD (hyperlipidemia)   . HTN (hypertension)   . Iron deficiency anemia   . Macular degeneration   . Normal pressure hydrocephalus   . 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 APPENDECTOMY    . BLADDER SURGERY     cancer  . CATARACT EXTRACTION     with lens implants  . COLONOSCOPY  07/21/2006  . INTRAMEDULLARY (IM) NAIL INTERTROCHANTERIC Left 10/14/2017   Procedure: INTRAMEDULLARY (IM) NAIL INTERTROCHANTRIC;  Surgeon: Paralee Cancel, MD;  Location: [facility] ORS;  Service: Orthopedics;  Laterality: Left;  . NEPHRECTOMY    . TONSILLECTOMY    . URETHRA SURGERY     stricture  . VENTRICULOPERITONEAL SHUNT  7/20178   HPI:  84 year old male with advanced dementia, DM2, bladder cancer, CAD who lives in an assisted living. Pt presented to ED after a mechanical fall. He was found to have a left hip fracture. Underwent ORIF on 3/12. Pt developed multifocal pna, ARF with hypoxia.    Assessment / Plan / Recommendation Clinical Impression  Pt demonstrates signs of dysphagia and aspiration following hip fracture. Pt is alert and participatory but easily fatigues with minimal activity. Multiple swallows, weak coughing and wet vocal quality following small sips of water and pudding are indicative of  suspected hyolaryngeal weakness, decreased airway protection with probable aspiration and residuals. Discussed high risk of aspiration with all PO with pts daughter as well as multiple comorbidities impacting safety. Given daughters concern for pts safety but also comfort, would allow minimal ice chips and a few bites of pudding or fruit if pt desires PO with plan for objective testing.  SLP Visit Diagnosis: Dysphagia, oropharyngeal phase (R13.12)    Aspiration Risk  Severe aspiration risk    Diet Recommendation NPO except meds;Ice chips PRN after oral care   Supervision: Full supervision/cueing for compensatory strategies Compensations: Slow rate;Small sips/bites Postural Changes: Seated upright at 90 degrees    Other  Recommendations Oral Care Recommendations: Oral care QID Other Recommendations: Have oral suction available   Follow up Recommendations Skilled Nursing facility;24 hour supervision/assistance      Frequency and Duration min 2x/week  2 weeks       Prognosis        Swallow Study   General HPI: 84 year old male with advanced dementia, DM2, bladder cancer, CAD who lives in an assisted living. Pt presented to ED after a mechanical fall. He was found to have a left hip fracture. Underwent ORIF on 3/12. Pt developed multifocal pna, ARF with hypoxia.  Type of Study: Bedside Swallow Evaluation Previous Swallow Assessment: none Diet Prior to this Study: Regular;Thin liquids Temperature Spikes Noted: No Respiratory Status: Nasal cannula History of Recent Intubation: (for procedure) Behavior/Cognition: Alert;Cooperative;Lethargic/Drowsy Oral Cavity Assessment: Within Functional Limits  Oral Care Completed by SLP: No Vision: Functional for self-feeding Self-Feeding Abilities: Able to feed self Patient Positioning: Upright in bed Baseline Vocal Quality: Wet Volitional Cough: Weak Volitional Swallow: Able to elicit    Oral/Motor/Sensory Function Overall Oral Motor/Sensory  Function: Within functional limits   Ice Chips Ice chips: Impaired Pharyngeal Phase Impairments: Wet Vocal Quality   Thin Liquid Thin Liquid: Impaired Presentation: Cup Pharyngeal  Phase Impairments: Multiple swallows;Wet Vocal Quality;Cough - Delayed;Decreased hyoid-laryngeal movement    Nectar Thick Nectar Thick Liquid: Not tested   Honey Thick Honey Thick Liquid: Not tested   Puree Puree: Impaired Presentation: Spoon Pharyngeal Phase Impairments: Wet Vocal Quality   Solid   GO   Solid: Not tested       Herbie [place], MA CCC-SLP [phone]  Broc Caspers, Katherene Ponto 10/25/2017,2:37 PM

## 2017-10-25 NOTE — Progress Notes (Signed)
Patient ID: [name]., male   DOB: June 17, 1925, 84 y.o.   MRN: [id number]  PROGRESS NOTE    [name].  [id number]:[id number] DOB: 1924/08/17 DOA: 10/17/2017  PCP: Lajean Manes, MD   Brief Narrative:  84 year old male with advanced dementia, DM2, bladder cancer, CAD who lives in an assisted living. Pt presented to ED after a mechanical fall. He was found to have a left hip fracture. Underwent ORIF on 3/12  Assessment & Plan:   Principal Problem:   Closed left hip fracture ([place]) - S/P ORIF 10/19/2017 - Per surgery, Lovenox for 2 weeks, iron tabs TID for 2-3 weeks and f/u in 2 weeks - Continue PT  Active Problems:   Leukocytosis / Multifocal pneumonia / Acute respiratory failure with hypoxia - CXR concerning for multifocal pneumonia - Stable resp status but he was hypoxic in past 24 hours, down to 86% - Continue to monitor in SDU fo next 24 hours in case he requires BiPAP    Anemia associated with acute blood loss - S/P 1 U pRBC transfusion 3/15 - Hgb 8.6 this am    Hypokalemia - Supplemented     Essential hypertension - Continue Cardizem 180 mg daily     Anxiety and depression - Continue Abilify, Buspar, Zoloft  - Continue Remeron     DVT prophylaxis: Lovenox subQ Code Status: DNR/DNI  Family Communication: no family this am at bedside Disposition Plan: monitor in SDU for next 24 hours    Consultants:   Ortho  Procedures:   ORIF 3/12  1 U PRBC 3/15  Antimicrobials:   Zosyn -->   Subjective: No overnight events.  Objective: Vitals:   10/25/17 0759 10/25/17 0800 10/25/17 1000 10/25/17 1200  BP:  (!) 108/54 (!) 115/58 127/81  Pulse:      Resp:  (!) 31 (!) 30 (!) 28  Temp: 97.6 F (36.4 C)     TempSrc: Oral     SpO2:  100% 100% 95%  Weight:      Height:        Intake/Output Summary (Last 24 hours) at 10/25/2017 1313 Last data filed at 10/25/2017 1204 Gross per 24 hour  Intake 512.5 ml  Output 500 ml  Net 12.5 ml   Filed Weights     10/24/17 0334 10/24/17 0700 10/25/17 0319  Weight: 74.1 kg (163 lb 5.8 oz) 72.5 kg (159 lb 13.3 oz) 73.9 kg (162 lb 14.7 oz)    Physical Exam  Constitutional: Appears well-developed and well-nourished. No distress.  CVS: Rate controlled, S1/S2 +  Pulmonary: no wheezing, coarse sounds  Abdominal: Soft. BS +,  no distension, tenderness Musculoskeletal: Normal range of motion. No tenderness.  Lymphadenopathy: No lymphadenopathy noted, cervical, inguinal. Neuro: sleeping but quickly opens eyes when called his name  Skin: Skin is warm and dry.  Psychiatric: o agitation or restlessness    Data Reviewed: I have personally reviewed following labs and imaging studies  CBC: Recent Labs  Lab 10/12/2017 0015 10/21/17 0556 10/22/17 0553 10/23/17 0324 10/23/17 2053 10/24/17 0328 10/25/17 0308  WBC 11.5* 12.6* 11.1* 15.7*  --  17.0* 13.8*  NEUTROABS 8.7*  --   --   --   --   --   --   HGB 12.0* 8.3* 7.3* 7.6* 9.4* 8.9* 8.6*  HCT 36.5* 24.9* 22.6* 23.0* 29.5* 27.1* 26.1*  MCV 92.2 92.6 94.2 93.9  --  90.9 91.3  PLT 284 220 213 265  --  270 281  Basic Metabolic Panel: Recent Labs  Lab 10/21/17 0556 10/22/17 0553 10/23/17 0324 10/24/17 0328 10/25/17 0308  NA 139 140 141 144 143  K 4.6 4.2 3.9 3.2* 3.3*  CL 108 110 107 106 108  CO2 25 24 23 26 25   GLUCOSE 139* 149* 127* 147* 140*  BUN 22* 25* 30* 37* 39*  CREATININE 0.96 0.89 1.01 1.14 1.05  CALCIUM 8.1* 7.9* 8.0* 8.0* 7.8*   GFR: Estimated Creatinine Clearance: 48.9 mL/min (by C-G formula based on SCr of 1.05 mg/dL). Liver Function Tests: No results for input(s): AST, ALT, ALKPHOS, BILITOT, PROT, ALBUMIN in the last 168 hours. No results for input(s): LIPASE, AMYLASE in the last 168 hours. No results for input(s): AMMONIA in the last 168 hours. Coagulation Profile: Recent Labs  Lab 10/14/2017 0015  INR 1.01   Cardiac Enzymes: No results for input(s): CKTOTAL, CKMB, CKMBINDEX, TROPONINI in the last 168 hours. BNP (last  3 results) No results for input(s): PROBNP in the last 8760 hours. HbA1C: No results for input(s): HGBA1C in the last 72 hours. CBG: No results for input(s): GLUCAP in the last 168 hours. Lipid Profile: No results for input(s): CHOL, HDL, LDLCALC, TRIG, CHOLHDL, LDLDIRECT in the last 72 hours. Thyroid Function Tests: No results for input(s): TSH, T4TOTAL, FREET4, T3FREE, THYROIDAB in the last 72 hours. Anemia Panel: No results for input(s): VITAMINB12, FOLATE, FERRITIN, TIBC, IRON, RETICCTPCT in the last 72 hours.  Sepsis Labs: @LABRCNTIP (procalcitonin:4,lacticidven:4)   Surgical PCR screen     Status: None   Collection Time: 10/25/2017  6:14 AM  Result Value Ref Range Status   MRSA, PCR NEGATIVE NEGATIVE Final   Staphylococcus aureus NEGATIVE NEGATIVE Final  MRSA PCR Screening     Status: None   Collection Time: 10/22/17  3:20 PM  Result Value Ref Range Status   MRSA by PCR NEGATIVE NEGATIVE Final      Radiology Studies: Dg Chest Port 1 View Result Date: 10/23/2017 Patchy bibasilar infiltrate concerning for pneumonia. Small left pleural effusion. Lungs elsewhere clear. Stable cardiac silhouette.   Dg Chest Port 1 View Result Date: 10/22/2017 Bibasilar parenchymal opacities suspicious for multifocal pneumonia. Recommend follow-up with two-view chest x-ray.    Scheduled Meds: . ARIPiprazole  2.5 mg Oral Daily  . busPIRone  5 mg Oral BID  . cholecalciferol  2,000 Units Oral Daily  . diltiazem  180 mg Oral Daily  . docusate sodium  100 mg Oral BID  . enoxaparin  40 mg Subcutaneous Q24H  . mirtazapine  7.5 mg Oral QHS  . multivitamin  1 tablet Oral BID  . sertraline  50 mg Oral Daily   Continuous Infusions: . piperacillin-tazobactam (ZOSYN)  IV 3.375 g (10/25/17 1204)     LOS: 5 days    Time spent: 25 minutes Greater than 50% of the time spent on counseling and coordinating the care.   Leisa Lenz, MD [facility] Pager [phone]  If 7PM-7AM, please  contact night-coverage [email or web address] Password [email or web address] 10/25/2017, 1:13 PM

## 2017-10-26 ENCOUNTER — Inpatient Hospital Stay (HOSPITAL_COMMUNITY): Payer: Medicare Other

## 2017-10-26 DIAGNOSIS — R0682 Tachypnea, not elsewhere classified: Secondary | ICD-10-CM

## 2017-10-26 DIAGNOSIS — I5022 Chronic systolic (congestive) heart failure: Secondary | ICD-10-CM

## 2017-10-26 LAB — CBC
HCT: 27.4 % — ABNORMAL LOW (ref 39.0–52.0)
Hemoglobin: 8.7 g/dL — ABNORMAL LOW (ref 13.0–17.0)
MCH: 30.1 pg (ref 26.0–34.0)
MCHC: 31.8 g/dL (ref 30.0–36.0)
MCV: 94.8 fL (ref 78.0–100.0)
PLATELETS: 303 10*3/uL (ref 150–400)
RBC: 2.89 MIL/uL — AB (ref 4.22–5.81)
RDW: 16.7 % — ABNORMAL HIGH (ref 11.5–15.5)
WBC: 13.4 10*3/uL — ABNORMAL HIGH (ref 4.0–10.5)

## 2017-10-26 LAB — BASIC METABOLIC PANEL
Anion gap: 10 (ref 5–15)
BUN: 39 mg/dL — AB (ref 6–20)
CHLORIDE: 111 mmol/L (ref 101–111)
CO2: 25 mmol/L (ref 22–32)
Calcium: 7.9 mg/dL — ABNORMAL LOW (ref 8.9–10.3)
Creatinine, Ser: 1 mg/dL (ref 0.61–1.24)
GFR calc Af Amer: >60 mL/min (ref >60)
GLUCOSE: 131 mg/dL — AB (ref 65–99)
POTASSIUM: 3.5 mmol/L (ref 3.5–5.1)
Sodium: 146 mmol/L — ABNORMAL HIGH (ref 135–145)

## 2017-10-26 NOTE — Progress Notes (Signed)
Patient ID: [name]., male   DOB: 1924-09-08, 84 y.o.   MRN: [id number] Subjective: 6 Days Post-Op Procedure(s) (LRB): INTRAMEDULLARY (IM) NAIL INTERTROCHANTRIC (Left)    Patient remains in the ICU on step down care.  No reported events.  Confusion dominates care. Shallow respirations but O2 sats remain consistent  Objective:   VITALS:   Vitals:   10/26/17 0400 10/26/17 0600  BP: 119/73 130/69  Pulse:    Resp: (!) 41 (!) 29  Temp:    SpO2: 99% 100%    Neurovascular intact Incision: dressing C/D/I - left hip  LABS Recent Labs    10/24/17 0328 10/25/17 0308 10/26/17 0343  HGB 8.9* 8.6* 8.7*  HCT 27.1* 26.1* 27.4*  WBC 17.0* 13.8* 13.4*  PLT 270 281 303    Recent Labs    10/24/17 0328 10/25/17 0308 10/26/17 0343  NA 144 143 146*  K 3.2* 3.3* 3.5  BUN 37* 39* 39*  CREATININE 1.14 1.05 1.00  GLUCOSE 147* 140* 131*    No results for input(s): LABPT, INR in the last 72 hours.   Assessment/Plan: 6 Days Post-Op Procedure(s) (LRB): INTRAMEDULLARY (IM) NAIL INTERTROCHANTRIC (Left)   Up with therapy as able WBAT LLE due to inability to reliably follow instruction.  Transfers and limited assisted ambulation RTC in 2 weeks DVT prophylaxis Call [phone] with any questions re disposition needs

## 2017-10-26 NOTE — Progress Notes (Signed)
Pt scheduled for MBS today at approximately 830 am= RN made aware.  Thanks.  Luanna Salk, [place] [facility] SLP [phone]

## 2017-10-26 NOTE — Progress Notes (Signed)
PROGRESS NOTE  [name]. FWY:[id number] DOB: [birth year] DOA: 10/21/2017 PCP: Lajean Manes, MD  HPI/Recap of past 12 hours: 84 year old male with advanced dementia, DM2, bladder cancer, CAD who lives in an assisted living. Pt presented to ED after a mechanical fall. He was found to have a left hip fracture. Underwent ORIF on 3/12  10/26/17: Patient seen and examined at bedside.  He is alert but confused in the setting of dementia.  Has no new complaints.  He denies chest pain or dyspnea no abdominal pain or nausea.  Assessment/Plan: Principal Problem:   Closed left hip fracture (HCC) Active Problems:   Anemia associated with acute blood loss   Depression  Close left hip fracture status post ORIF 11/07/2017 Per surgery continue Lovenox for 2 weeks Continue ferrous sulfate supplement 3 times daily for 2-3 weeks Follow-up with orthopedic surgery in 2 weeks Continue PT  Acute hypoxia respiratory failure secondary to multifocal pneumonia HCAP, poa Chest x-ray concerning for multifocal pneumonia Febrile overnight Continue IV antibiotic zosyn Continue O2 supplementation to maintain O2 saturation 92% on above  Anemia associated with acute blood loss Status post 1 unit PRBC transfusion on 10/23/2017 Hemoglobin stable  Hypokalemia: Resolved Replete as indicated  Hypertension blood pressure is well controlled Continue p.o. Cardizem 180 daily  Anxiety and depression Continue Zoloft BuSpar Remeron  Chronic heart failure with reduced EF 40-45% Continue cardiac meds 2D echo done on 10/24/2017 revealed moderate hypokinesis of the apical myocardium  Dysphasia to find chopped solids and liquids Aspiration precaution    Code Status: DNR  Family Communication: None at bedside   Disposition Plan: To be determined   Consultants:  None  Procedures:  None  Antimicrobials:  None  DVT prophylaxis: SCDs, sq lovenox daily   Objective: Vitals:   10/26/17 0600  10/26/17 0822 10/26/17 1200 10/26/17 1557  BP: 130/69  (!) 141/63   Pulse:      Resp: (!) 29  (!) 32   Temp:  (!) 97 F (36.1 C) (!) 96 F (35.6 C) (!) 96 F (35.6 C)  TempSrc:  Axillary Axillary Axillary  SpO2: 100%  (!) 79%   Weight:      Height:        Intake/Output Summary (Last 24 hours) at 10/26/2017 1721 Last data filed at 10/26/2017 0558 Gross per 24 hour  Intake 70 ml  Output 380 ml  Net -310 ml   Filed Weights   10/24/17 0700 10/25/17 0319 10/26/17 0400  Weight: 72.5 kg (159 lb 13.3 oz) 73.9 kg (162 lb 14.7 oz) 73.3 kg (161 lb 9.6 oz)    Exam:   General: 84 year old male well-developed well-nourished in no acute distress.  Alert and confused in the setting of advanced dementia  Cardiovascular: Regular rate and rhythm with no murmurs or rubs  Respiratory: Mild rales at bases with no wheezing noted  Abdomen: Soft nontender nondistended normal bowel sounds x4  Musculoskeletal: No focal deficits noted  Psychiatry: Mood appropriate for condition and setting   Data Reviewed: CBC: Recent Labs  Lab 10/19/2017 0015  10/22/17 0553 10/23/17 0324 10/23/17 2053 10/24/17 0328 10/25/17 0308 10/26/17 0343  WBC 11.5*   < > 11.1* 15.7*  --  17.0* 13.8* 13.4*  NEUTROABS 8.7*  --   --   --   --   --   --   --   HGB 12.0*   < > 7.3* 7.6* 9.4* 8.9* 8.6* 8.7*  HCT 36.5*   < >  22.6* 23.0* 29.5* 27.1* 26.1* 27.4*  MCV 92.2   < > 94.2 93.9  --  90.9 91.3 94.8  PLT 284   < > 213 265  --  270 281 303   < > = values in this interval not displayed.   Basic Metabolic Panel: Recent Labs  Lab 10/22/17 0553 10/23/17 0324 10/24/17 0328 10/25/17 0308 10/26/17 0343  NA 140 141 144 143 146*  K 4.2 3.9 3.2* 3.3* 3.5  CL 110 107 106 108 111  CO2 24 23 26 25 25   GLUCOSE 149* 127* 147* 140* 131*  BUN 25* 30* 37* 39* 39*  CREATININE 0.89 1.01 1.14 1.05 1.00  CALCIUM 7.9* 8.0* 8.0* 7.8* 7.9*   GFR: Estimated Creatinine Clearance: 50.9 mL/min (by C-G formula based on SCr of 1  mg/dL). Liver Function Tests: No results for input(s): AST, ALT, ALKPHOS, BILITOT, PROT, ALBUMIN in the last 168 hours. No results for input(s): LIPASE, AMYLASE in the last 168 hours. No results for input(s): AMMONIA in the last 168 hours. Coagulation Profile: Recent Labs  Lab 10/09/2017 0015  INR 1.01   Cardiac Enzymes: No results for input(s): CKTOTAL, CKMB, CKMBINDEX, TROPONINI in the last 168 hours. BNP (last 3 results) No results for input(s): PROBNP in the last 8760 hours. HbA1C: No results for input(s): HGBA1C in the last 72 hours. CBG: No results for input(s): GLUCAP in the last 168 hours. Lipid Profile: No results for input(s): CHOL, HDL, LDLCALC, TRIG, CHOLHDL, LDLDIRECT in the last 72 hours. Thyroid Function Tests: No results for input(s): TSH, T4TOTAL, FREET4, T3FREE, THYROIDAB in the last 72 hours. Anemia Panel: No results for input(s): VITAMINB12, FOLATE, FERRITIN, TIBC, IRON, RETICCTPCT in the last 72 hours. Urine analysis:    Component Value Date/Time   COLORURINE YELLOW 09/04/2017 0109   APPEARANCEUR CLEAR 09/04/2017 0109   LABSPEC 1.016 09/04/2017 0109   PHURINE 5.0 09/04/2017 0109   GLUCOSEU NEGATIVE 09/04/2017 0109   GLUCOSEU NEGATIVE 06/12/2008 1003   HGBUR NEGATIVE 09/04/2017 0109   BILIRUBINUR NEGATIVE 09/04/2017 0109   KETONESUR NEGATIVE 09/04/2017 0109   PROTEINUR NEGATIVE 09/04/2017 0109   UROBILINOGEN 1.0 03/10/2010 1456   NITRITE NEGATIVE 09/04/2017 0109   LEUKOCYTESUR SMALL (A) 09/04/2017 0109   Sepsis Labs: @LABRCNTIP (procalcitonin:4,lacticidven:4)  ) Recent Results (from the past 240 hour(s))  Surgical PCR screen     Status: None   Collection Time: 10/16/2017  6:14 AM  Result Value Ref Range Status   MRSA, PCR NEGATIVE NEGATIVE Final   Staphylococcus aureus NEGATIVE NEGATIVE Final    Comment: (NOTE) The Xpert SA Assay (FDA approved for NASAL specimens in patients 43 years of age and older), is one component of a  comprehensive surveillance program. It is not intended to diagnose infection nor to guide or monitor treatment. Performed at [facility], [place] [address] [place], [place] [address]   MRSA PCR Screening     Status: None   Collection Time: 10/22/17  3:20 PM  Result Value Ref Range Status   MRSA by PCR NEGATIVE NEGATIVE Final    Comment:        The GeneXpert MRSA Assay (FDA approved for NASAL specimens only), is one component of a comprehensive MRSA colonization surveillance program. It is not intended to diagnose MRSA infection nor to guide or monitor treatment for MRSA infections. Performed at [facility], [place] [address] [place], [place] [address]       Studies: Dg Swallowing Func-speech Pathology  Result Date: 10/26/2017 Objective  Swallowing Evaluation: Type of Study: MBS-Modified Barium Swallow Study  Patient Details Name: Paxson Harrower. MRN: [id number] Date of Birth: [birth year] Today's Date: 10/26/2017 Time: SLP Start Time (ACUTE ONLY): [phone] -SLP Stop Time (ACUTE ONLY): 0900 SLP Time Calculation (min) (ACUTE ONLY): 22 min Past Medical History: Past Medical History: Diagnosis Date . Adenomatous polyps 2003, 2006, 2007 . Bladder cancer ([place])  . CAD (coronary artery disease)  . COPD (chronic obstructive pulmonary disease) ([place])  . Depression  . Diverticulosis 07/2006 . DM (diabetes mellitus) ([place])  . Hemorrhoids, external  . Hemorrhoids, internal  . History of prostate cancer  . HLD (hyperlipidemia)  . HTN (hypertension)  . Iron deficiency anemia  . Macular degeneration  . Normal pressure hydrocephalus  . 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 APPENDECTOMY   . BLADDER SURGERY    cancer . CATARACT EXTRACTION    with lens implants . COLONOSCOPY  07/21/2006 . INTRAMEDULLARY (IM) NAIL INTERTROCHANTERIC Left 10/25/2017  Procedure: INTRAMEDULLARY (IM) NAIL INTERTROCHANTRIC;  Surgeon: Paralee Cancel, MD;  Location: [facility];  Service: Orthopedics;  Laterality: Left; . NEPHRECTOMY   . TONSILLECTOMY   . URETHRA SURGERY    stricture . VENTRICULOPERITONEAL SHUNT  02/7658 HPI: 84 year old male with advanced dementia, DM2, bladder cancer, CAD who lives in an assisted living. Pt presented to ED after a mechanical fall. He was found to have a left hip fracture. Underwent ORIF on 3/12. Pt developed multifocal pna, ARF with hypoxia.  Subjective: pt awake in chair, hunched forward and leaning to the left Assessment / Plan / Recommendation CHL IP CLINICAL IMPRESSIONS 10/26/2017 Clinical Impression Moderate oropharyngeal dysphagia with sensorimotor deficits noted.  Pt with weakness which results in lingual pumping, delayed oral transiting and premature spillage of boluses into pharynx and oral residuals.  Pharyngeal swallow characterized by decreased epiglottic deflection which allows laryngeal penetration/trace aspiration of thin mixed with secretions and residuals that pt does not sense.  Cues to dry swallow not effective but liquid swallows decrease oropharyngeal residuals.  Pt was able to cough an expectorate viscous secretions after MBS.  Pt's dysphagia is concerning for aspiration and ability to meet nutritional needs given effort/work of eating/swallowing.  Of note, pt with clear left facial asymmetry and leaning left during MBS - note he has a h/o right frontal hydrocephalus s/p VP shunt.  To mitigate aspiration risk, MD may desire to allow dys2/thin diet with strict precautions.  Will follow up for skilled dysphagia management/family/pt education.   SLP Visit Diagnosis Dysphagia, oropharyngeal phase (R13.12) Attention and concentration deficit following -- Frontal lobe and executive function deficit following -- Impact on safety and function Moderate aspiration risk;Severe aspiration risk   CHL IP TREATMENT RECOMMENDATION 10/26/2017 Treatment Recommendations Therapy as outlined in treatment plan below    Prognosis 10/26/2017 Prognosis for Safe Diet Advancement Guarded Barriers to Reach Goals Cognitive deficits;Severity of deficits Barriers/Prognosis Comment -- CHL IP DIET RECOMMENDATION 10/26/2017 SLP Diet Recommendations Dysphagia 2 (Fine chop) solids;Thin liquid Liquid Administration via Straw Medication Administration Whole meds with puree Compensations Slow rate;Small sips/bites;Follow solids with liquid;Other (Comment) Postural Changes --   CHL IP OTHER RECOMMENDATIONS 10/26/2017 Recommended Consults -- Oral Care Recommendations Oral care before and after PO Other Recommendations Have oral suction available   CHL IP FOLLOW UP RECOMMENDATIONS 10/25/2017 Follow up Recommendations Skilled Nursing facility;24 hour supervision/assistance   CHL IP FREQUENCY AND DURATION 10/26/2017 Speech Therapy Frequency (ACUTE ONLY) min 2x/week Treatment Duration 2 weeks  CHL IP ORAL PHASE 10/26/2017 Oral Phase Impaired Oral - Pudding Teaspoon -- Oral - Pudding Cup -- Oral - Honey Teaspoon -- Oral - Honey Cup -- Oral - Nectar Teaspoon Weak lingual manipulation;Lingual pumping;Premature spillage;Delayed oral transit;Decreased bolus cohesion;Reduced posterior propulsion;Lingual/palatal residue Oral - Nectar Cup -- Oral - Nectar Straw Lingual pumping;Premature spillage;Weak lingual manipulation;Delayed oral transit;Decreased bolus cohesion;Lingual/palatal residue;Reduced posterior propulsion Oral - Thin Teaspoon Weak lingual manipulation;Delayed oral transit;Lingual pumping;Premature spillage;Decreased bolus cohesion;Reduced posterior propulsion;Lingual/palatal residue Oral - Thin Cup Premature spillage;Delayed oral transit;Decreased bolus cohesion;Lingual pumping;Weak lingual manipulation;Reduced posterior propulsion;Lingual/palatal residue Oral - Thin Straw Decreased bolus cohesion;Delayed oral transit;Reduced posterior propulsion;Lingual/palatal residue Oral - Puree Decreased bolus cohesion;Premature spillage;Weak lingual  manipulation;Lingual pumping;Delayed oral transit;Reduced posterior propulsion;Lingual/palatal residue Oral - Mech Soft -- Oral - Regular Decreased bolus cohesion;Delayed oral transit;Lingual pumping;Weak lingual manipulation;Impaired mastication;Piecemeal swallowing;Premature spillage;Reduced posterior propulsion;Lingual/palatal residue Oral - Multi-Consistency -- Oral - Pill -- Oral Phase - Comment --  CHL IP PHARYNGEAL PHASE 10/26/2017 Pharyngeal Phase Impaired Pharyngeal- Pudding Teaspoon -- Pharyngeal -- Pharyngeal- Pudding Cup -- Pharyngeal -- Pharyngeal- Honey Teaspoon -- Pharyngeal -- Pharyngeal- Honey Cup -- Pharyngeal -- Pharyngeal- Nectar Teaspoon Reduced epiglottic inversion;Reduced tongue base retraction;Pharyngeal residue - valleculae Pharyngeal -- Pharyngeal- Nectar Cup -- Pharyngeal -- Pharyngeal- Nectar Straw Reduced epiglottic inversion;Reduced tongue base retraction;Pharyngeal residue - valleculae Pharyngeal -- Pharyngeal- Thin Teaspoon Reduced epiglottic inversion;Reduced tongue base retraction;Pharyngeal residue - valleculae Pharyngeal -- Pharyngeal- Thin Cup Reduced epiglottic inversion;Pharyngeal residue - valleculae;Penetration/Apiration after swallow;Reduced airway/laryngeal closure;Reduced laryngeal elevation Pharyngeal Material enters airway, passes BELOW cords without attempt by patient to eject out (silent aspiration) Pharyngeal- Thin Straw Penetration/Apiration after swallow;Reduced laryngeal elevation;Reduced epiglottic inversion;Reduced airway/laryngeal closure;Reduced tongue base retraction;Pharyngeal residue - valleculae Pharyngeal -- Pharyngeal- Puree Delayed swallow initiation-vallecula;Pharyngeal residue - valleculae;Reduced epiglottic inversion;Reduced tongue base retraction Pharyngeal -- Pharyngeal- Mechanical Soft -- Pharyngeal -- Pharyngeal- Regular Delayed swallow initiation-vallecula;Reduced tongue base retraction;Pharyngeal residue - valleculae;Reduced epiglottic inversion  Pharyngeal -- Pharyngeal- Multi-consistency -- Pharyngeal -- Pharyngeal- Pill -- Pharyngeal -- Pharyngeal Comment --  CHL IP CERVICAL ESOPHAGEAL PHASE 10/26/2017 Cervical Esophageal Phase WFL Pudding Teaspoon -- Pudding Cup -- Honey Teaspoon -- Honey Cup -- Nectar Teaspoon -- Nectar Cup -- Nectar Straw -- Thin Teaspoon -- Thin Cup -- Thin Straw -- Puree -- Mechanical Soft -- Regular -- Multi-consistency -- Pill -- Cervical Esophageal Comment -- No flowsheet data found. Macario Golds 10/26/2017, 10:02 AM Luanna Salk, [place] [facility] SLP [phone]               Scheduled Meds: . ARIPiprazole  2.5 mg Oral Daily  . busPIRone  5 mg Oral BID  . cholecalciferol  2,000 Units Oral Daily  . diltiazem  180 mg Oral Daily  . docusate sodium  100 mg Oral BID  . enoxaparin (LOVENOX) injection  40 mg Subcutaneous Q24H  . mouth rinse  15 mL Mouth Rinse BID  . mirtazapine  7.5 mg Oral QHS  . multivitamin  1 tablet Oral BID  . sertraline  50 mg Oral Daily    Continuous Infusions: . piperacillin-tazobactam (ZOSYN)  IV 3.375 g (10/26/17 1249)     LOS: 6 days     Kayleen Memos, MD [facility] Pager [phone]  If 7PM-7AM, please contact night-coverage [email or web address] Password [facility] 10/26/2017, 5:21 PM

## 2017-10-26 NOTE — Progress Notes (Signed)
Modified Barium Swallow Progress Note  Patient Details  Name: [name]. MRN: [id number] Date of Birth: 12-07-1924  Today's Date: 10/26/2017  Modified Barium Swallow completed.  Full report located under Chart Review in the Imaging Section.  Brief recommendations include the following:  Clinical Impression  Moderate oropharyngeal dysphagia with sensorimotor deficits noted.  Pt with weakness which results in lingual pumping, delayed oral transiting and premature spillage of boluses into pharynx and oral residuals.  Pharyngeal swallow characterized by decreased epiglottic deflection which allows laryngeal penetration/trace aspiration of thin mixed with secretions and residuals that pt does not sense.  Cues to dry swallow not effective but liquid swallows decrease oropharyngeal residuals.  Pt was able to cough an expectorate viscous secretions after MBS.  Pt's dysphagia is concerning for aspiration and ability to meet nutritional needs given effort/work of eating/swallowing.  Of note, pt with clear left facial asymmetry and leaning left during MBS - note he has a h/o right frontal hydrocephalus s/p VP shunt.  To mitigate aspiration risk, MD may desire to allow dys2/thin diet with strict precautions.  Will follow up for skilled dysphagia management/family/pt education.     Swallow Evaluation Recommendations       SLP Diet Recommendations: Dysphagia 2 (Fine chop) solids;Thin liquid   Liquid Administration via: Straw   Medication Administration: Whole meds with puree   Supervision: Full assist for feeding;Full supervision/cueing for compensatory strategies   Compensations: Slow rate;Small sips/bites;Follow solids with liquid;Other (Comment)(cough and expectorate after meals)       Oral Care Recommendations: Oral care before and after PO   Other Recommendations: Have oral suction available    Macario Golds 10/26/2017,10:01 AM Luanna Salk, [place] [facility] SLP [phone]

## 2017-10-26 NOTE — Progress Notes (Signed)
Physical Therapy Treatment Patient Details Name: [name]. MRN: [id number] DOB: 15-Jul-1925 Today's Date: 10/26/2017    History of Present Illness 84 y.o. male, nursing home resident, with history of bladder cancer, diabetes mellitus, coronary artery disease and COPD in addition to advanced dementia who was brought to the emergency room after a ground level fall and resulting left hip fracture. s/p IM nail.    PT Comments    Pt cooperative although is progressing slowly with mobility; continue to recommend SNF level care post acute  Follow Up Recommendations  SNF;Supervision/Assistance - 24 hour     Equipment Recommendations  None recommended by PT    Recommendations for Other Services       Precautions / Restrictions Precautions Precautions: Fall Restrictions Weight Bearing Restrictions: No Other Position/Activity Restrictions: WBAT    Mobility  Bed Mobility Overal bed mobility: Needs Assistance Bed Mobility: Rolling;Sidelying to Sit Rolling: Max assist Sidelying to sit: Total assist;+2 for safety/equipment;+2 for physical assistance;Max assist       General bed mobility comments: assist to raise trunk and advance BLEs, pt initiateds movement requires heavy max to total assist of 2 to complete  Transfers Overall transfer level: Needs assistance Equipment used: None Transfers: Stand Pivot Transfers   Stand pivot transfers: +2 physical assistance;Total assist       General transfer comment: assist to rise and pivot; unable to come to full stand  Ambulation/Gait             General Gait Details: unable   Stairs            Wheelchair Mobility    Modified Rankin (Stroke Patients Only)       Balance   Sitting-balance support: Feet supported;Single extremity supported Sitting balance-Leahy Scale: Fair Sitting balance - Comments: once assisted to EOB pt able to maintain midline/static sit with supervision     Standing balance-Leahy  Scale: Zero                              Cognition Arousal/Alertness: Awake/alert Behavior During Therapy: WFL for tasks assessed/performed Overall Cognitive Status: No family/caregiver present to determine baseline cognitive functioning                                 General Comments: pt oriented to self, follows one step commands consistently      Exercises      General Comments        Pertinent Vitals/Pain Pain Assessment: Faces Faces Pain Scale: Hurts even more Pain Location: L hip with movement Pain Descriptors / Indicators: Grimacing;Guarding Pain Intervention(s): Monitored during session    Home Living                      Prior Function            PT Goals (current goals can now be found in the care plan section) Acute Rehab PT Goals Patient Stated Goal: none stated PT Goal Formulation: Patient unable to participate in goal setting Time For Goal Achievement: 11/04/17 Potential to Achieve Goals: Fair Progress towards PT goals: Progressing toward goals(slowly)    Frequency    Min 3X/week      PT Plan Current plan remains appropriate    Co-evaluation              AM-PAC PT "6 Clicks" Daily Activity  Outcome Measure  Difficulty turning over in bed (including adjusting bedclothes, sheets and blankets)?: Unable Difficulty moving from lying on back to sitting on the side of the bed? : Unable Difficulty sitting down on and standing up from a chair with arms (e.g., wheelchair, bedside commode, etc,.)?: Unable Help needed moving to and from a bed to chair (including a wheelchair)?: Total Help needed walking in hospital room?: Total Help needed climbing 3-5 steps with a railing? : Total 6 Click Score: 6    End of Session Equipment Utilized During Treatment: Gait belt Activity Tolerance: Patient limited by fatigue;Patient limited by pain Patient left: in chair;with chair alarm set;with call bell/phone within  reach;with nursing/sitter in room Nurse Communication: Need for lift equipment;Mobility status PT Visit Diagnosis: Muscle weakness (generalized) (M62.81);History of falling (Z91.81);Pain Pain - Right/Left: Left Pain - part of body: Hip     Time: 1314-3888 PT Time Calculation (min) (ACUTE ONLY): 17 min  Charges:  $Therapeutic Activity: 8-22 mins                    G CodesKenyon Ana, PT Pager: [phone] 10/26/2017    Kenyon Ana 10/26/2017, 2:03 PM

## 2017-10-27 ENCOUNTER — Inpatient Hospital Stay (HOSPITAL_COMMUNITY): Payer: Medicare Other

## 2017-10-27 DIAGNOSIS — J189 Pneumonia, unspecified organism: Secondary | ICD-10-CM

## 2017-10-27 DIAGNOSIS — R262 Difficulty in walking, not elsewhere classified: Secondary | ICD-10-CM

## 2017-10-27 LAB — CBC
HCT: 27.6 % — ABNORMAL LOW (ref 39.0–52.0)
Hemoglobin: 8.6 g/dL — ABNORMAL LOW (ref 13.0–17.0)
MCH: 29.4 pg (ref 26.0–34.0)
MCHC: 31.2 g/dL (ref 30.0–36.0)
MCV: 94.2 fL (ref 78.0–100.0)
PLATELETS: 279 10*3/uL (ref 150–400)
RBC: 2.93 MIL/uL — ABNORMAL LOW (ref 4.22–5.81)
RDW: 16.4 % — AB (ref 11.5–15.5)
WBC: 12.1 10*3/uL — ABNORMAL HIGH (ref 4.0–10.5)

## 2017-10-27 LAB — OCCULT BLOOD X 1 CARD TO LAB, STOOL: Fecal Occult Bld: POSITIVE — AB

## 2017-10-27 LAB — PROCALCITONIN: Procalcitonin: <0.1 ng/mL

## 2017-10-27 MED ORDER — FERROUS SULFATE 325 (65 FE) MG PO TABS
325.0000 mg | ORAL_TABLET | Freq: Every day | ORAL | Status: DC
  Administered 2017-10-27 – 2017-10-28 (×2): 325 mg via ORAL
  Filled 2017-10-27 (×2): qty 1

## 2017-10-27 NOTE — Clinical Social Work Placement (Addendum)
Authorization received. Physician canceling D/c for today.  [facility] insurance authorization started at [facility].  Olena Leatherwood to complete SNF admission paperwork at 2pm  [place]  NOTE  Date:  10/27/2017  Patient Details  Name: [name]. MRN: [id number] Date of Birth: 28-Jan-1925  Clinical Social Work is seeking post-discharge placement for this patient at the [place] level of care (*CSW will initial, date and re-position this form in  chart as items are completed):  Yes   Patient/family provided with [place] Work Department's list of facilities offering this level of care within the geographic area requested by the patient (or if unable, by the patient's family).  Yes   Patient/family informed of their freedom to choose among providers that offer the needed level of care, that participate in Medicare, Medicaid or managed care program needed by the patient, have an available bed and are willing to accept the patient.  Yes   Patient/family informed of 's ownership interest in [facility] and [facility], as well as of the fact that they are under no obligation to receive care at these facilities.  PASRR submitted to EDS on       PASRR number received on       Existing PASRR number confirmed on 10/27/17     FL2 transmitted to all facilities in geographic area requested by pt/family on       FL2 transmitted to all facilities within larger geographic area on 10/21/17     Patient informed that his/her managed care company has contracts with or will negotiate with certain facilities, including the following:  [place]     Yes   Patient/family informed of bed offers received.  Patient chooses bed at [facility]     Physician recommends and patient chooses bed at      Patient to be transferred to [facility] on 10/27/17.  Patient to be transferred to  facility by [facility]     Patient family notified on 10/27/17 of transfer.  Name of family member notified:  Friend-Mike     PHYSICIAN Please sign FL2, Please prepare priority discharge summary, including medications, Please sign DNR     Additional Comment:    _______________________________________________ Lia Hopping, LCSW 10/27/2017, 11:44 AM

## 2017-10-27 NOTE — Progress Notes (Signed)
CSW following to assist with patient discharge to SNF for short rehab before returning back to ALF.   CSW discussed d/c planning with patient [name].   [facility] reviewing patient clinicals at this time.   Insurance Authorization needed before d/c. Expected D/C today.  Kathrin Greathouse, Latanya Presser, MSW Clinical Social Worker  [phone] 10/27/2017  9:49 AM

## 2017-10-27 NOTE — Progress Notes (Signed)
PROGRESS NOTE  [name]. DTO:[id number] DOB: 11/10/1924 DOA: 10/13/2017 PCP: Lajean Manes, MD  HPI/Recap of past 63 hours: 84 year old male with advanced dementia, DM2, bladder cancer, CAD who lives in an assisted living. Pt presented to ED after a mechanical fall. He was found to have a left hip fracture. Underwent ORIF on 3/12  10/26/17: Patient seen and examined at bedside.  He is alert but confused in the setting of dementia.  Has no new complaints.  He denies chest pain or dyspnea no abdominal pain or nausea.  10/24/2017: Patient seen and examined at his bedside.  He denies dyspnea or chest pain.  No acute events reported overnight.  Assessment/Plan: Principal Problem:   Closed left hip fracture (HCC) Active Problems:   Anemia associated with acute blood loss   Depression  Close left hip fracture status post ORIF 10/19/2017 Per surgery continue Lovenox for 2 weeks Continue ferrous sulfate supplement 3 times daily for 2-3 weeks Follow-up with orthopedic surgery in 2 weeks Continue PT PT recommends SNF Discharge to SNF for short-term rehab prior to returning to assisted living facility. Fall precaution  Acute hypoxia respiratory failure secondary to multifocal pneumonia HCAP, poa Chest x-ray concerning for multifocal pneumonia  Afebrile CBC and pro-calcitonin ordered this morning Continue IV antibiotic zosyn day #5/5 Continue O2 supplementation to maintain O2 saturation 92% on above-wean off O2 supplementation as tolerated  Anemia associated with acute blood loss Status post 1 unit PRBC transfusion on 10/23/2017 Hemoglobin stable Repeat CBC this morning  Hypokalemia: Resolved Replete as indicated  Hypertension blood pressure is well controlled Continue p.o. Cardizem 180 daily  Anxiety and depression Continue Zoloft BuSpar Remeron  Chronic heart failure with reduced EF 40-45% Continue cardiac meds 2D echo done on 10/24/2017 revealed moderate hypokinesis of  the apical myocardium  Dysphagia II Chopped solids and liquids Aspiration precaution  Chronic constipation Continue MiraLAX    Code Status: DNR  Family Communication: None at bedside   Disposition Plan: SNF possibly tomorrow if no acute events overnight  Consultants:  None  Procedures:  None  Antimicrobials:  IV Zosyn day #5/5   DVT prophylaxis: SCDs, sq lovenox daily   Objective: Vitals:   10/27/17 0400 10/27/17 0500 10/27/17 0600 10/27/17 0800  BP: (!) 146/69  (!) 131/55 (!) 123/57  Pulse:      Resp: (!) 28  (!) 36 (!) 33  Temp:    98.5 F (36.9 C)  TempSrc:    Axillary  SpO2: 99%  99% 97%  Weight:  73.3 kg (161 lb 9.6 oz)    Height:        Intake/Output Summary (Last 24 hours) at 10/27/2017 1216 Last data filed at 10/27/2017 0500 Gross per 24 hour  Intake 210 ml  Output 250 ml  Net -40 ml   Filed Weights   10/25/17 0319 10/26/17 0400 10/27/17 0500  Weight: 73.9 kg (162 lb 14.7 oz) 73.3 kg (161 lb 9.6 oz) 73.3 kg (161 lb 9.6 oz)    Exam: 10/27/2017.  Patient seen and examined.  Physical exam essentially unchanged from prior except for what is mentioned below.   General: 84 year old Caucasian male well-developed well-nourished in no acute distress.    Cardiovascular: Regular rate and rhythm with no murmurs or rubs  Respiratory: Mild rales noted at bases with no wheezing.   Abdomen: Soft nontender nondistended normal bowel sounds x4  Musculoskeletal: No focal deficits noted  Psychiatry: Mood appropriate for condition and setting   Data  Reviewed: CBC: Recent Labs  Lab 10/23/17 0324 10/23/17 2053 10/24/17 0328 10/25/17 0308 10/26/17 0343 10/27/17 0159  WBC 15.7*  --  17.0* 13.8* 13.4* 12.1*  HGB 7.6* 9.4* 8.9* 8.6* 8.7* 8.6*  HCT 23.0* 29.5* 27.1* 26.1* 27.4* 27.6*  MCV 93.9  --  90.9 91.3 94.8 94.2  PLT 265  --  270 281 303 161   Basic Metabolic Panel: Recent Labs  Lab 10/22/17 0553 10/23/17 0324 10/24/17 0328 10/25/17 0308  10/26/17 0343  NA 140 141 144 143 146*  K 4.2 3.9 3.2* 3.3* 3.5  CL 110 107 106 108 111  CO2 24 23 26 25 25   GLUCOSE 149* 127* 147* 140* 131*  BUN 25* 30* 37* 39* 39*  CREATININE 0.89 1.01 1.14 1.05 1.00  CALCIUM 7.9* 8.0* 8.0* 7.8* 7.9*   GFR: Estimated Creatinine Clearance: 50.9 mL/min (by C-G formula based on SCr of 1 mg/dL). Liver Function Tests: No results for input(s): AST, ALT, ALKPHOS, BILITOT, PROT, ALBUMIN in the last 168 hours. No results for input(s): LIPASE, AMYLASE in the last 168 hours. No results for input(s): AMMONIA in the last 168 hours. Coagulation Profile: No results for input(s): INR, PROTIME in the last 168 hours. Cardiac Enzymes: No results for input(s): CKTOTAL, CKMB, CKMBINDEX, TROPONINI in the last 168 hours. BNP (last 3 results) No results for input(s): PROBNP in the last 8760 hours. HbA1C: No results for input(s): HGBA1C in the last 72 hours. CBG: No results for input(s): GLUCAP in the last 168 hours. Lipid Profile: No results for input(s): CHOL, HDL, LDLCALC, TRIG, CHOLHDL, LDLDIRECT in the last 72 hours. Thyroid Function Tests: No results for input(s): TSH, T4TOTAL, FREET4, T3FREE, THYROIDAB in the last 72 hours. Anemia Panel: No results for input(s): VITAMINB12, FOLATE, FERRITIN, TIBC, IRON, RETICCTPCT in the last 72 hours. Urine analysis:    Component Value Date/Time   COLORURINE YELLOW 09/04/2017 0109   APPEARANCEUR CLEAR 09/04/2017 0109   LABSPEC 1.016 09/04/2017 0109   PHURINE 5.0 09/04/2017 0109   GLUCOSEU NEGATIVE 09/04/2017 0109   GLUCOSEU NEGATIVE 06/12/2008 1003   HGBUR NEGATIVE 09/04/2017 0109   BILIRUBINUR NEGATIVE 09/04/2017 0109   KETONESUR NEGATIVE 09/04/2017 0109   PROTEINUR NEGATIVE 09/04/2017 0109   UROBILINOGEN 1.0 03/10/2010 1456   NITRITE NEGATIVE 09/04/2017 0109   LEUKOCYTESUR SMALL (A) 09/04/2017 0109   Sepsis Labs: @LABRCNTIP (procalcitonin:4,lacticidven:4)  ) Recent Results (from the past 240 hour(s))    Surgical PCR screen     Status: None   Collection Time: 10/21/2017  6:14 AM  Result Value Ref Range Status   MRSA, PCR NEGATIVE NEGATIVE Final   Staphylococcus aureus NEGATIVE NEGATIVE Final    Comment: (NOTE) The Xpert SA Assay (FDA approved for NASAL specimens in patients 69 years of age and older), is one component of a comprehensive surveillance program. It is not intended to diagnose infection nor to guide or monitor treatment. Performed at [facility], [place] [address] [place], [place]  [address]   MRSA PCR Screening     Status: None   Collection Time: 10/22/17  3:20 PM  Result Value Ref Range Status   MRSA by PCR NEGATIVE NEGATIVE Final    Comment:        The GeneXpert MRSA Assay (FDA approved for NASAL specimens only), is one component of a comprehensive MRSA colonization surveillance program. It is not intended to diagnose MRSA infection nor to guide or monitor treatment for MRSA infections. Performed at [facility], [address] [place],  [place] [address]       Studies: No results found.  Scheduled Meds: . ARIPiprazole  2.5 mg Oral Daily  . busPIRone  5 mg Oral BID  . cholecalciferol  2,000 Units Oral Daily  . diltiazem  180 mg Oral Daily  . docusate sodium  100 mg Oral BID  . enoxaparin (LOVENOX) injection  40 mg Subcutaneous Q24H  . ferrous sulfate  325 mg Oral Q breakfast  . mouth rinse  15 mL Mouth Rinse BID  . mirtazapine  7.5 mg Oral QHS  . multivitamin  1 tablet Oral BID  . sertraline  50 mg Oral Daily    Continuous Infusions: . piperacillin-tazobactam (ZOSYN)  IV Stopped (10/27/17 1027)     LOS: 7 days     Kayleen Memos, MD [facility] Pager [phone]  If 7PM-7AM, please contact night-coverage [email or web address] Password [facility] 10/27/2017, 12:16 PM

## 2017-10-27 NOTE — NC FL2 (Signed)
[place] LEVEL OF CARE SCREENING TOOL     IDENTIFICATION  Patient Name: [name]. Birthdate: 19-Jul-1925 Sex: male Admission Date (Current Location): 11/03/2017  [facility] and [place] Number:  Herbalist and Address:  [facility],  [address] [place], [place]      Provider Number: [id number]  Attending Physician Name and Address:  Kayleen Memos, DO  Relative Name and Phone Number:       Current Level of Care: Hospital Recommended Level of Care: [place] Prior Approval Number:    Date Approved/Denied:   PASRR Number: [id number].  Discharge Plan: SNF    Current Diagnoses: Patient Active Problem List   Diagnosis Date Noted  . Closed left hip fracture ([place]) 10/21/2017  . Anemia associated with acute blood loss 10/21/2017  . Depression 10/21/2017  . Adjustment disorder with depressed mood 09/04/2017  . NORMAL PRESSURE HYDROCEPHALUS 04/12/2010  . DEPRESSION 12/15/2007  . DIABETES MELLITUS, TYPE II 05/10/2007  . HYPERLIPIDEMIA 05/10/2007  . HYPERTENSION 05/10/2007  . CORONARY ARTERY DISEASE 05/10/2007  . COPD 05/10/2007    Orientation RESPIRATION BLADDER Height & Weight     Self  Normal Continent Weight: 161 lb 9.6 oz (73.3 kg) Height:  6' (182.9 cm)  BEHAVIORAL SYMPTOMS/MOOD NEUROLOGICAL BOWEL NUTRITION STATUS      Continent Diet(Regular)  AMBULATORY STATUS COMMUNICATION OF NEEDS Skin   Extensive Assist Verbally Skin abrasions, Other (Comment)(Surgical Incision)                       Personal Care Assistance Level of Assistance  Bathing, Feeding, Dressing Bathing Assistance: Limited assistance Feeding assistance: Independent Dressing Assistance: Limited assistance     Functional Limitations Info  Sight, Hearing, Speech Sight Info: Adequate Hearing Info: Adequate Speech Info: Adequate    SPECIAL CARE FACTORS FREQUENCY  PT (By licensed PT), OT (By licensed OT)     PT Frequency:  5X/WEEK OT Frequency: 5X/WEEK            Contractures Contractures Info: Not present    Additional Factors Info  Code Status, Allergies, Psychotropic Code Status Info: DNR Allergies Info: Allergies: Iron, Melatonin, Mirtazapine, Sulfonamide Derivatives Psychotropic Info: Zoloft 50 mg daily for depression, Lexapro 20 mg daily for depression,Zoloft 50 mg daily for depression,Remeron 7.5 mg at bedtime for sleep, Ativan 0.5 mg BID for anxiety, and Buspar 5 mg BID for anxiety."         Current Medications (10/27/2017):  This is the current hospital active medication list Current Facility-Administered Medications  Medication Dose Route Frequency Provider Last Rate Last Dose  . acetaminophen (TYLENOL) tablet 650 mg  650 mg Oral Q6H PRN Merton Border, MD   650 mg at 10/21/17 3976   Or  . acetaminophen (TYLENOL) suppository 650 mg  650 mg Rectal Q6H PRN Merton Border, MD      . ARIPiprazole (ABILIFY) tablet 2.5 mg  2.5 mg Oral Daily Merton Border, MD   2.5 mg at 10/27/17 1025  . busPIRone (BUSPAR) tablet 5 mg  5 mg Oral BID Merton Border, MD   5 mg at 10/27/17 1024  . cholecalciferol (VITAMIN D) tablet 2,000 Units  2,000 Units Oral Daily Merton Border, MD   2,000 Units at 10/27/17 1024  . diltiazem (CARDIZEM CD) 24 hr capsule 180 mg  180 mg Oral Daily Debbe Odea, MD   180 mg at 10/27/17 1025  . docusate sodium (COLACE) capsule 100 mg  100 mg  Oral BID Danae Orleans, PA-C   100 mg at 10/27/17 1021  . enoxaparin (LOVENOX) injection 40 mg  40 mg Subcutaneous Q24H Danae Orleans, PA-C   40 mg at 10/27/17 4503  . ferrous sulfate tablet 325 mg  325 mg Oral Q breakfast Kayleen Memos, DO   325 mg at 10/27/17 8882  . HYDROcodone-acetaminophen (NORCO/VICODIN) 5-325 MG per tablet 1-2 tablet  1-2 tablet Oral Q4H PRN Merton Border, MD   1 tablet at 10/25/17 0236  . ipratropium-albuterol (DUONEB) 0.5-2.5 (3) MG/3ML nebulizer solution 3 mL  3 mL Nebulization Q4H PRN Robbie Lis, MD      . MEDLINE mouth rinse   15 mL Mouth Rinse BID Debbe Odea, MD   15 mL at 10/26/17 2205  . menthol-cetylpyridinium (CEPACOL) lozenge 3 mg  1 lozenge Oral PRN Danae Orleans, PA-C       Or  . phenol (CHLORASEPTIC) mouth spray 1 spray  1 spray Mouth/Throat PRN Babish, Matthew, PA-C      . mirtazapine (REMERON) tablet 7.5 mg  7.5 mg Oral QHS Merton Border, MD   7.5 mg at 10/26/17 2110  . morphine 4 MG/ML injection 1 mg  1 mg Intravenous Q4H PRN Debbe Odea, MD   1 mg at 10/26/17 0549  . multivitamin (PROSIGHT) tablet 1 tablet  1 tablet Oral BID Merton Border, MD   1 tablet at 10/27/17 1024  . ondansetron (ZOFRAN) tablet 4 mg  4 mg Oral Q6H PRN Merton Border, MD       Or  . ondansetron (ZOFRAN) injection 4 mg  4 mg Intravenous Q6H PRN Merton Border, MD      . piperacillin-tazobactam (ZOSYN) IVPB 3.375 g  3.375 g Intravenous Q8H Shade, Haze Justin, RPH   Stopped at 10/27/17 1027  . polyethylene glycol (MIRALAX / GLYCOLAX) packet 17 g  17 g Oral Daily PRN Babish, Matthew, PA-C      . sertraline (ZOLOFT) tablet 50 mg  50 mg Oral Daily Merton Border, MD   50 mg at 10/27/17 1023  . traZODone (DESYREL) tablet 50 mg  50 mg Oral QHS PRN Debbe Odea, MD   50 mg at 10/26/17 2107     Discharge Medications: Please see discharge summary for a list of discharge medications.  Relevant Imaging Results:  Relevant Lab Results:   Additional Information ssn: [phone]  Lia Hopping, LCSW

## 2017-10-27 NOTE — Progress Notes (Signed)
Pt. has not had any urinary output since this am. Bladder scan revealed 270 ml bladder volume. Notified MD. Awaiting further orders.  Pt. has had 2 episodes of small amount of black stools.Will continue to monitor.

## 2017-10-28 ENCOUNTER — Inpatient Hospital Stay (HOSPITAL_COMMUNITY): Payer: Medicare Other

## 2017-10-28 LAB — CBC
HCT: 26.2 % — ABNORMAL LOW (ref 39.0–52.0)
HEMOGLOBIN: 8.1 g/dL — AB (ref 13.0–17.0)
MCH: 30.1 pg (ref 26.0–34.0)
MCHC: 30.9 g/dL (ref 30.0–36.0)
MCV: 97.4 fL (ref 78.0–100.0)
Platelets: 301 10*3/uL (ref 150–400)
RBC: 2.69 MIL/uL — ABNORMAL LOW (ref 4.22–5.81)
RDW: 16.7 % — ABNORMAL HIGH (ref 11.5–15.5)
WBC: 12.2 10*3/uL — ABNORMAL HIGH (ref 4.0–10.5)

## 2017-10-28 LAB — BASIC METABOLIC PANEL
ANION GAP: 11 (ref 5–15)
BUN: 30 mg/dL — ABNORMAL HIGH (ref 6–20)
CHLORIDE: 110 mmol/L (ref 101–111)
CO2: 28 mmol/L (ref 22–32)
CREATININE: 1.1 mg/dL (ref 0.61–1.24)
Calcium: 7.8 mg/dL — ABNORMAL LOW (ref 8.9–10.3)
GFR calc non Af Amer: 57 mL/min — ABNORMAL LOW (ref >60)
GLUCOSE: 113 mg/dL — AB (ref 65–99)
Potassium: 3.2 mmol/L — ABNORMAL LOW (ref 3.5–5.1)
Sodium: 149 mmol/L — ABNORMAL HIGH (ref 135–145)

## 2017-10-28 LAB — MAGNESIUM: Magnesium: 2.5 mg/dL — ABNORMAL HIGH (ref 1.7–2.4)

## 2017-10-28 MED ORDER — POTASSIUM CHLORIDE CRYS ER 20 MEQ PO TBCR
40.0000 meq | EXTENDED_RELEASE_TABLET | Freq: Once | ORAL | Status: AC
  Administered 2017-10-28: 40 meq via ORAL
  Filled 2017-10-28: qty 2

## 2017-10-28 MED ORDER — BUSPIRONE HCL 5 MG PO TABS: 5.0000 mg | ORAL_TABLET | Freq: Two times a day (BID) | ORAL | Status: DC

## 2017-10-28 NOTE — Progress Notes (Signed)
[facility] PROGRESS NOTE  [name]. NID:[id number] DOB: September 09, 1924 DOA: 11/07/2017  PCP: Lajean Manes, MD  Brief History/Interval Summary: 84 year old Caucasian male with advanced dementia, diabetes mellitus type 2, history of bladder cancer, coronary artery disease who lived in assisted living facility and presented after a mechanical fall.  He was found to have left hip fracture.  He underwent repair on 3/12.  Stay was complicated by multifocal pneumonia resulting in acute hypoxic respiratory failure.   Reason for Visit: Acute hypoxic respiratory failure  Consultants: None  Procedures: None  Antibiotics: Zosyn  Subjective/Interval History: Patient poorly responsive this morning. He does open his eyes but goes right back to sleep.  Baseline is not known.  According to nursing staff he is less responsive today compared to yesterday.  ROS: Unable to do.  Objective:  Vital Signs  Vitals:   10/28/17 0357 10/28/17 0400 10/28/17 0600 10/28/17 0800  BP:  (!) 118/55 (!) 144/59 (!) 130/52  Pulse:      Resp:  (!) 31 (!) 25 (!) 24  Temp: 98.1 F (36.7 C)     TempSrc: Axillary     SpO2:  90% (!) 87% (!) 88%  Weight:      Height:        Intake/Output Summary (Last 24 hours) at 10/28/2017 1312 Last data filed at 10/27/2017 1723 Gross per 24 hour  Intake 25 ml  Output -  Net 25 ml   Filed Weights   10/25/17 0319 10/26/17 0400 10/27/17 0500  Weight: 73.9 kg (162 lb 14.7 oz) 73.3 kg (161 lb 9.6 oz) 73.3 kg (161 lb 9.6 oz)    General appearance: fatigued and no distress Head: Normocephalic, without obvious abnormality, atraumatic Resp: Noted to be tachypneic.  Diminished air entry at the bases but no crackles wheezing or rhonchi heard. Cardio: regular rate and rhythm, S1, S2 normal, no murmur, click, rub or gallop GI: soft, non-tender; bowel sounds normal; no masses,  no organomegaly Extremities: extremities normal, atraumatic, no cyanosis or  edema Neurologic: Arousable but very somnolent.  No obvious focal deficits noted.  Lab Results:  Data Reviewed: I have personally reviewed following labs and imaging studies  CBC: Recent Labs  Lab 10/24/17 0328 10/25/17 0308 10/26/17 0343 10/27/17 0159 10/28/17 0339  WBC 17.0* 13.8* 13.4* 12.1* 12.2*  HGB 8.9* 8.6* 8.7* 8.6* 8.1*  HCT 27.1* 26.1* 27.4* 27.6* 26.2*  MCV 90.9 91.3 94.8 94.2 97.4  PLT 270 281 303 279 144    Basic Metabolic Panel: Recent Labs  Lab 10/23/17 0324 10/24/17 0328 10/25/17 0308 10/26/17 0343 10/28/17 0339  NA 141 144 143 146* 149*  K 3.9 3.2* 3.3* 3.5 3.2*  CL 107 106 108 111 110  CO2 23 26 25 25 28   GLUCOSE 127* 147* 140* 131* 113*  BUN 30* 37* 39* 39* 30*  CREATININE 1.01 1.14 1.05 1.00 1.10  CALCIUM 8.0* 8.0* 7.8* 7.9* 7.8*  MG  --   --   --   --  2.5*    GFR: Estimated Creatinine Clearance: 46.3 mL/min (by C-G formula based on SCr of 1.1 mg/dL).    Recent Results (from the past 240 hour(s))  Surgical PCR screen     Status: None   Collection Time: 10/30/2017  6:14 AM  Result Value Ref Range Status   MRSA, PCR NEGATIVE NEGATIVE Final   Staphylococcus aureus NEGATIVE NEGATIVE Final    Comment: (NOTE) The Xpert SA Assay (FDA approved for NASAL specimens in patients 22  years of age and older), is one component of a comprehensive surveillance program. It is not intended to diagnose infection nor to guide or monitor treatment. Performed at [facility], [place] [address] [place], [place] [address]   MRSA PCR Screening     Status: None   Collection Time: 10/22/17  3:20 PM  Result Value Ref Range Status   MRSA by PCR NEGATIVE NEGATIVE Final    Comment:        The GeneXpert MRSA Assay (FDA approved for NASAL specimens only), is one component of a comprehensive MRSA colonization surveillance program. It is not intended to diagnose MRSA infection nor to guide or monitor treatment for MRSA infections. Performed at  [facility], [place] [address], [place], [place] [address]       Radiology Studies: Dg Chest Port 1 View  Result Date: 10/27/2017 CLINICAL DATA:  Hypoxia. EXAM: PORTABLE CHEST 1 VIEW COMPARISON:  10/23/2017. FINDINGS: Cardiomegaly with normal pulmonary vascularity. Low lung volumes with bibasilar atelectasis/infiltrates. Small bilateral pleural effusions. These findings were improved slightly from prior exam. IMPRESSION: Cardiomegaly with normal pulmonary vascularity. 2. Low lung volumes with bibasilar atelectasis/infiltrates and small bilateral pleural effusions. Findings have improved slightly from prior exam. Electronically Signed   By: Clare   On: 10/27/2017 13:41     Medications:  Scheduled: . ARIPiprazole  2.5 mg Oral Daily  . busPIRone  5 mg Oral BID  . cholecalciferol  2,000 Units Oral Daily  . diltiazem  180 mg Oral Daily  . docusate sodium  100 mg Oral BID  . ferrous sulfate  325 mg Oral Q breakfast  . mouth rinse  15 mL Mouth Rinse BID  . mirtazapine  7.5 mg Oral QHS  . multivitamin  1 tablet Oral BID  . sertraline  50 mg Oral Daily   Continuous: . piperacillin-tazobactam (ZOSYN)  IV 3.375 g (10/28/17 1231)   OZH:[id number] **OR** acetaminophen, HYDROcodone-acetaminophen, ipratropium-albuterol, menthol-cetylpyridinium **OR** phenol, morphine injection, ondansetron **OR** ondansetron (ZOFRAN) IV, polyethylene glycol, traZODone  Assessment/Plan:  Principal Problem:   Closed left hip fracture (HCC) Active Problems:   Anemia associated with acute blood loss   Depression    Closed left hip fracture status post ORIF on 3/12 Appears to be stable from a surgical standpoint.  DVT prophylaxis with Lovenox as recommended however we will need to hold this for now due to dropping hemoglobin and heme positive stools.  Acute hypoxic respiratory failure/multifocal pneumonia Thought to be secondary to multifocal pneumonia.  Chest x-ray yesterday  did show slight improvement.  However patient noted to be more hypoxic today.  He is on Zosyn which will be continued.  Repeat chest x-ray today.  Oxygen requirements have also increased.  He has been seen by speech therapy.  Noted to have aspiration risk.  He is on a dysphagia diet.  Full aspiration precautions to be followed.  Pro-calcitonin less than 0.1.  Acute blood loss anemia Thought to be secondary to surgical loss.  He did require 1 unit of transfusion on 3/15.  Hemoglobin has been slowly trending down.  Was noted to have dark stools but he is on iron tablets.  Stool was heme positive.  Last colonoscopy in 2007 when diverticulosis and polyps were noted.  We will hold Lovenox for now.  Transfuse as needed.  At this point in time patient does not appear to be a candidate for endoscopic evaluation due to tenuous respiratory status.  We will not pursue this as long  as he does not have overt bleeding.  Acute metabolic encephalopathy Patient has underlying dementia.  Baseline is not clearly known.  Per nursing staff he is noted to be more encephalopathic today compared to yesterday.  Could be due to hypoxia.  Continue to monitor closely for now.  Hold sedative agents.  Hold his psychotropic medications for now.  Hypokalemia We will replete  Essential hypertension Continue home medications.  Monitor blood pressures closely.  History of anxiety and depression As discussed above we will hold his medications due to alteration in mental status.  History of chronic systolic CHF EF noted to be 40-45%.  He appears to be euvolemic.  Continue current medications.  Dysphagia As discussed above.  Chronic constipation MiraLAX.  History of ventriculomegaly He has a shunt placed.  CT head on 3/11 without any acute process.  DVT Prophylaxis: Hold Lovenox.  SCDs.    Code Status: DNR Family Communication: No family at bedside.  Discussed with patient's power of attorney, Ronalee Belts. Disposition Plan:  Management as outlined above.    LOS: 8 days   [facility] Pager [phone] 10/28/2017, 1:12 PM  If 7PM-7AM, please contact night-coverage at [email or web address], password [facility]

## 2017-10-29 DIAGNOSIS — J9601 Acute respiratory failure with hypoxia: Secondary | ICD-10-CM

## 2017-10-29 DIAGNOSIS — R0902 Hypoxemia: Secondary | ICD-10-CM

## 2017-10-29 DIAGNOSIS — J9602 Acute respiratory failure with hypercapnia: Secondary | ICD-10-CM

## 2017-10-29 DIAGNOSIS — D62 Acute posthemorrhagic anemia: Secondary | ICD-10-CM

## 2017-10-29 DIAGNOSIS — N179 Acute kidney failure, unspecified: Secondary | ICD-10-CM

## 2017-10-29 DIAGNOSIS — G9341 Metabolic encephalopathy: Secondary | ICD-10-CM

## 2017-10-29 LAB — CBC
HCT: 28.3 % — ABNORMAL LOW (ref 39.0–52.0)
Hemoglobin: 8.4 g/dL — ABNORMAL LOW (ref 13.0–17.0)
MCH: 30.2 pg (ref 26.0–34.0)
MCHC: 29.7 g/dL — AB (ref 30.0–36.0)
MCV: 101.8 fL — AB (ref 78.0–100.0)
PLATELETS: 328 10*3/uL (ref 150–400)
RBC: 2.78 MIL/uL — AB (ref 4.22–5.81)
RDW: 17.3 % — AB (ref 11.5–15.5)
WBC: 15.2 10*3/uL — ABNORMAL HIGH (ref 4.0–10.5)

## 2017-10-29 LAB — HEPATIC FUNCTION PANEL
ALBUMIN: 2.5 g/dL — AB (ref 3.5–5.0)
ALK PHOS: 50 U/L (ref 38–126)
ALT: 11 U/L — AB (ref 17–63)
AST: 17 U/L (ref 15–41)
BILIRUBIN TOTAL: 0.6 mg/dL (ref 0.3–1.2)
Bilirubin, Direct: 0.1 mg/dL (ref 0.1–0.5)
Indirect Bilirubin: 0.5 mg/dL (ref 0.3–0.9)
TOTAL PROTEIN: 5.8 g/dL — AB (ref 6.5–8.1)

## 2017-10-29 LAB — BLOOD GAS, ARTERIAL
ACID-BASE DEFICIT: 1 mmol/L (ref 0.0–2.0)
Bicarbonate: 27.8 mmol/L (ref 20.0–28.0)
Drawn by: 295031
FIO2: 80
LHR: 15 {breaths}/min
O2 SAT: 98 %
PEEP: 5 cmH2O
PH ART: 7.177 — AB (ref 7.350–7.450)
PRESSURE CONTROL: 10 cmH2O
Patient temperature: 98.6
pCO2 arterial: 77.9 mmHg (ref 32.0–48.0)
pO2, Arterial: 133 mmHg — ABNORMAL HIGH (ref 83.0–108.0)

## 2017-10-29 LAB — TSH: TSH: 2.596 u[IU]/mL (ref 0.350–4.500)

## 2017-10-29 LAB — BASIC METABOLIC PANEL
Anion gap: 14 (ref 5–15)
BUN: 37 mg/dL — AB (ref 6–20)
CO2: 26 mmol/L (ref 22–32)
CREATININE: 1.67 mg/dL — AB (ref 0.61–1.24)
Calcium: 8 mg/dL — ABNORMAL LOW (ref 8.9–10.3)
Chloride: 112 mmol/L — ABNORMAL HIGH (ref 101–111)
GFR calc Af Amer: 40 mL/min — ABNORMAL LOW (ref >60)
GFR, EST NON AFRICAN AMERICAN: 34 mL/min — AB (ref >60)
GLUCOSE: 105 mg/dL — AB (ref 65–99)
POTASSIUM: 4.5 mmol/L (ref 3.5–5.1)
SODIUM: 152 mmol/L — AB (ref 135–145)

## 2017-10-29 LAB — AMMONIA: AMMONIA: 13 umol/L (ref 9–35)

## 2017-10-29 MED ORDER — LORAZEPAM 2 MG/ML IJ SOLN
1.0000 mg | INTRAMUSCULAR | Status: DC | PRN
  Administered 2017-10-29: 1 mg via INTRAVENOUS
  Filled 2017-10-29: qty 1

## 2017-10-29 MED ORDER — MORPHINE SULFATE (PF) 4 MG/ML IV SOLN
2.0000 mg | INTRAVENOUS | Status: DC | PRN
Start: 2017-10-29 — End: 2017-10-30
  Administered 2017-10-29: 2 mg via INTRAVENOUS
  Filled 2017-10-29: qty 1

## 2017-10-29 MED ORDER — ONDANSETRON 4 MG PO TBDP: 4.0000 mg | ORAL_TABLET | Freq: Four times a day (QID) | ORAL | Status: DC | PRN

## 2017-10-29 MED ORDER — GLYCOPYRROLATE 0.2 MG/ML IJ SOLN: 0.2000 mg | INTRAMUSCULAR | Status: DC | PRN

## 2017-10-29 MED ORDER — LORAZEPAM 1 MG PO TABS: 1.0000 mg | ORAL_TABLET | ORAL | Status: DC | PRN

## 2017-10-29 MED ORDER — GLYCOPYRROLATE 0.2 MG/ML IJ SOLN
0.2000 mg | INTRAMUSCULAR | Status: DC | PRN
  Administered 2017-10-29: 0.2 mg via INTRAVENOUS
  Filled 2017-10-29: qty 1

## 2017-10-29 MED ORDER — HALOPERIDOL LACTATE 2 MG/ML PO CONC
0.5000 mg | ORAL | Status: DC | PRN
  Filled 2017-10-29: qty 0.3

## 2017-10-29 MED ORDER — BUSPIRONE HCL 5 MG PO TABS: 5.0000 mg | ORAL_TABLET | Freq: Two times a day (BID) | ORAL | Status: DC

## 2017-10-29 MED ORDER — POLYVINYL ALCOHOL 1.4 % OP SOLN: 1.0000 [drp] | Freq: Four times a day (QID) | OPHTHALMIC | Status: DC | PRN

## 2017-10-29 MED ORDER — BIOTENE DRY MOUTH MT LIQD: 15.0000 mL | OROMUCOSAL | Status: DC | PRN

## 2017-10-29 MED ORDER — THIAMINE HCL 100 MG/ML IJ SOLN
100.0000 mg | Freq: Every day | INTRAMUSCULAR | Status: DC
  Administered 2017-10-29: 100 mg via INTRAVENOUS
  Filled 2017-10-29: qty 2

## 2017-10-29 MED ORDER — ONDANSETRON HCL 4 MG/2ML IJ SOLN: 4.0000 mg | Freq: Four times a day (QID) | INTRAMUSCULAR | Status: DC | PRN

## 2017-10-29 MED ORDER — GLYCOPYRROLATE 1 MG PO TABS
1.0000 mg | ORAL_TABLET | ORAL | Status: DC | PRN
Start: 2017-10-29 — End: 2017-10-30

## 2017-10-29 MED ORDER — CYANOCOBALAMIN 1000 MCG/ML IJ SOLN
1000.0000 ug | Freq: Every day | INTRAMUSCULAR | Status: DC
  Administered 2017-10-29: 1000 ug via INTRAMUSCULAR
  Filled 2017-10-29: qty 1

## 2017-10-29 MED ORDER — LORAZEPAM 2 MG/ML PO CONC: 1.0000 mg | ORAL | Status: DC | PRN

## 2017-10-29 MED ORDER — DEXTROSE 5 % IV SOLN
INTRAVENOUS | Status: DC
  Administered 2017-10-29: 14:00:00 via INTRAVENOUS

## 2017-10-29 MED ORDER — HALOPERIDOL 1 MG PO TABS: 0.5000 mg | ORAL_TABLET | ORAL | Status: DC | PRN

## 2017-10-29 MED ORDER — SERTRALINE HCL 50 MG PO TABS: 50.0000 mg | ORAL_TABLET | Freq: Every day | ORAL | Status: DC

## 2017-10-29 MED ORDER — HALOPERIDOL LACTATE 5 MG/ML IJ SOLN: 0.5000 mg | INTRAMUSCULAR | Status: DC | PRN

## 2017-10-30 LAB — RPR: RPR Ser Ql: NONREACTIVE

## 2017-11-09 NOTE — Progress Notes (Signed)
PT Cancellation Note  Patient Details Name: [name]. MRN: [id number] DOB: 1925/02/27   Cancelled Treatment:    Reason Eval/Treat Not Completed: Medical issues which prohibited therapy--pt currently on BIPAP/experiencing respiratory issues. Will hold PT for now.    Weston Anna, MPT Pager: [phone]

## 2017-11-09 NOTE — Progress Notes (Signed)
Patients family went home after making patient comfort care and patient passed approximately 10 minutes after their departure. MD notified, patients family notified and they requested to donate body to science, 2 RNs verified, [employer] called. Patient had a fall at home so Medical Examiner called and case to be an ME case. Pt to transport to morgue with all lines intact.

## 2017-11-09 NOTE — Progress Notes (Signed)
[facility], pt was not suitable for donation. [name] referral number [phone]

## 2017-11-09 NOTE — Discharge Summary (Signed)
Death Summary  [name]. MWN:[id number] DOB: June 21, 1925 DOA: 2017-11-07  PCP: Lajean Manes, MD  Admit date: 11-07-2017 Date of Death: 18-Nov-2017  Cause of Death: Multifocal pneumonia in the setting of advanced dementia   History of present illness: 84 year old Caucasian male with advanced dementia, diabetes mellitus type 2, history of bladder cancer, coronary artery disease who lived in assisted living facility and presented after a mechanical fall.  He was found to have left hip fracture.  He underwent repair on 3/12.  Stay was complicated by multifocal pneumonia resulting in acute hypoxic respiratory failure.  Over the last 48 hours patient's mental status has worsened.  Overnight he had to be placed on BiPAP for worsening oxygenation.   Hospital Course:   Acute hypoxic and hypercapnic respiratory failure/multifocal pneumonia Patient's respiratory status worsened after surgery.  Quite possible he aspirated.  Imaging study showed multifocal pneumonia.  He was placed on Zosyn.  X-rays were repeated due to poor oxygenation and did not show any new findings.  He was seen by speech therapy.  Noted to have aspiration risk.  He was placed on a dysphagia diet.  Patient's oxygen requirements continued to increase.  ABG showed severe hypercapnic respiratory failure.  Other workup was negative.  Discussions were held with patient's power of attorney and it was felt that based on patient's known wishes he should be mainly comfort care.  He was taken off of BiPAP.  He was given morphine for pain and agitation.  Subsequently expired after a few hours.  Closed left hip fracture status post ORIF on 3/12 Did not have any immediate post surgical complications.  Acute blood loss anemia Thought to be secondary to surgical loss.  He did require 1 unit of transfusion on 3/15.  Was noted to have dark stools but he is on iron tablets.  Stool was heme positive.  Last colonoscopy in 2007 when  diverticulosis and polyps were noted.   Acute metabolic encephalopathy/dementia Patient has underlying dementia.  Now with worsening respiratory acidosis.    CT head was ordered however cannot be done as his respiratory status continued to decline.    Acute renal failure with hypernatremia He was started on IV fluids.  Essential hypertension/hypokalemia  History of anxiety and depression  History of chronic systolic CHF EF noted to be 40-45%.    Dysphagia  Chronic constipation  History of ventriculomegaly He has a shunt placed.  CT head on November 08, 2022 without any acute process.    Patient had a prolonged hospital course as outlined above.  He developed numerous medical problems after his hip surgery.  Patient continued to decline and then eventually passed away on 11-17-17.    The results of significant diagnostics from this hospitalization (including imaging, microbiology, ancillary and laboratory) are listed below for reference.    Significant Diagnostic Studies: Ct Head Wo Contrast  Result Date: November 07, 2017 CLINICAL DATA:  Left leg pain after fall from standing. Head trauma. EXAM: CT HEAD WITHOUT CONTRAST TECHNIQUE: Contiguous axial images were obtained from the base of the skull through the vertex without intravenous contrast. COMPARISON:  05/29/2011 FINDINGS: Brain: Chronic stable ventriculomegaly with shunt place from a right frontal approach. Patchy small vessel ischemic disease. No acute intracranial hemorrhage, midline shift or edema. No large vascular territory infarct. No intra-axial mass nor extra-axial fluid collections. Vascular: No hyperdense vessel sign. Moderate atherosclerosis of carotid siphons. Skull: No fracture nor bone destruction. Sinuses/Orbits: Bilateral lens replacements. Clear mastoids. Minimal mucosal thickening along the posterior left  maxillary sinus mild ethmoid sinus mucosal thickening noted as well. Other: None IMPRESSION: Ventriculomegaly with shunt in  place. No acute intracranial abnormality. Chronic small vessel ischemic disease. Electronically Signed   By: Ashley Royalty M.D.   On: 10/27/2017 23:30   Dg Chest Port 1 View  Result Date: 10/28/2017 CLINICAL DATA:  Hypoxia today, history COPD, coronary artery disease, diabetes mellitus, hypertension, former smoker EXAM: PORTABLE CHEST 1 VIEW COMPARISON:  Portable exam 1328 hours compared to 10/27/2017 FINDINGS: VP shunt tubing traverses RIGHT hemithorax. Borderline enlargement of cardiac silhouette. Atherosclerotic calcification aorta. Mediastinal contours and pulmonary vascularity normal. Bibasilar atelectasis versus consolidation greater on LEFT, unchanged. Upper lungs clear. No significant pleural effusion or pneumothorax. Bones demineralized. IMPRESSION: Bibasilar atelectasis versus consolidation LEFT greater than RIGHT unchanged. Electronically Signed   By: Lavonia Dana M.D.   On: 10/28/2017 13:55   Dg Chest Port 1 View  Result Date: 10/27/2017 CLINICAL DATA:  Hypoxia. EXAM: PORTABLE CHEST 1 VIEW COMPARISON:  10/23/2017. FINDINGS: Cardiomegaly with normal pulmonary vascularity. Low lung volumes with bibasilar atelectasis/infiltrates. Small bilateral pleural effusions. These findings were improved slightly from prior exam. IMPRESSION: Cardiomegaly with normal pulmonary vascularity. 2. Low lung volumes with bibasilar atelectasis/infiltrates and small bilateral pleural effusions. Findings have improved slightly from prior exam. Electronically Signed   By: [place]   On: 10/27/2017 13:41   Dg Chest Port 1 View  Result Date: 10/23/2017 CLINICAL DATA:  Hypoxia EXAM: PORTABLE CHEST 1 VIEW COMPARISON:  October 22, 2017 FINDINGS: Areas of focal airspace consolidation in both lung bases are again noted, concerning for focal pneumonia. There is a small left pleural effusion. Heart is upper normal in size. Pulmonary vascularity is within normal limits. No adenopathy. There is aortic atherosclerosis. No bone  lesions. A shunt catheter extends along the right hemithorax. IMPRESSION: Patchy bibasilar infiltrate concerning for pneumonia. Small left pleural effusion. Lungs elsewhere clear. Stable cardiac silhouette. There is aortic atherosclerosis. Aortic Atherosclerosis (ICD10-I70.0). Electronically Signed   By: Lowella Grip III M.D.   On: 10/23/2017 09:01   Dg Chest Port 1 View  Result Date: 10/22/2017 CLINICAL DATA:  Tachypnea, shortness of breath EXAM: PORTABLE CHEST 1 VIEW COMPARISON:  Chest x-ray of 10/20/2016 FINDINGS: There are parenchymal opacities at both lung bases suspicious for multifocal pneumonia. No definite pleural effusion is seen although small effusions cannot be excluded. Mediastinal and hilar contours are unremarkable. The heart is borderline enlarged and stable. No bony abnormality is seen. IMPRESSION: Bibasilar parenchymal opacities suspicious for multifocal pneumonia. Recommend follow-up with two-view chest x-ray. Electronically Signed   By: Ivar Drape M.D.   On: 10/22/2017 10:23   Dg Chest Portable 1 View  Result Date: 10/19/2017 CLINICAL DATA:  64-year-old male with hip fracture. Preop radiograph. EXAM: PORTABLE CHEST 1 VIEW COMPARISON:  Chest radiograph dated 10/28/2012 FINDINGS: Emphysema. No focal consolidation, pleural effusion, or pneumothorax. The cardiac silhouette is within normal limits. Atherosclerotic calcification of the aortic arch. Partially visualized VP shunt over the right chest. IMPRESSION: No active disease. Electronically Signed   By: Anner Crete M.D.   On: 10/20/2017 00:41   Dg Swallowing Func-speech Pathology  Result Date: 10/26/2017 Objective Swallowing Evaluation: Type of Study: MBS-Modified Barium Swallow Study  Patient Details Name: Marlo Goodrich. MRN: [id number] Date of Birth: 1925/04/06 Today's Date: 10/26/2017 Time: SLP Start Time (ACUTE ONLY): [phone] -SLP Stop Time (ACUTE ONLY): 0900 SLP Time Calculation (min) (ACUTE ONLY): 22 min Past Medical  History: Past Medical History: Diagnosis Date . Adenomatous polyps 2003,  2006, 2007 . Bladder cancer ([place])  . CAD (coronary artery disease)  . COPD (chronic obstructive pulmonary disease) ([place])  . Depression  . Diverticulosis 07/2006 . DM (diabetes mellitus) ([place])  . Hemorrhoids, external  . Hemorrhoids, internal  . History of prostate cancer  . HLD (hyperlipidemia)  . HTN (hypertension)  . Iron deficiency anemia  . Macular degeneration  . Normal pressure hydrocephalus  . 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 APPENDECTOMY   . BLADDER SURGERY    cancer . CATARACT EXTRACTION    with lens implants . COLONOSCOPY  07/21/2006 . INTRAMEDULLARY (IM) NAIL INTERTROCHANTERIC Left 10/22/2017  Procedure: INTRAMEDULLARY (IM) NAIL INTERTROCHANTRIC;  Surgeon: Paralee Cancel, MD;  Location: [facility];  Service: Orthopedics;  Laterality: Left; . NEPHRECTOMY   . TONSILLECTOMY   . URETHRA SURGERY    stricture . VENTRICULOPERITONEAL SHUNT  02/8086 HPI: 84 year old male with advanced dementia, DM2, bladder cancer, CAD who lives in an assisted living. Pt presented to ED after a mechanical fall. He was found to have a left hip fracture. Underwent ORIF on 3/12. Pt developed multifocal pna, ARF with hypoxia.  Subjective: pt awake in chair, hunched forward and leaning to the left Assessment / Plan / Recommendation CHL IP CLINICAL IMPRESSIONS 10/26/2017 Clinical Impression Moderate oropharyngeal dysphagia with sensorimotor deficits noted.  Pt with weakness which results in lingual pumping, delayed oral transiting and premature spillage of boluses into pharynx and oral residuals.  Pharyngeal swallow characterized by decreased epiglottic deflection which allows laryngeal penetration/trace aspiration of thin mixed with secretions and residuals that pt does not sense.  Cues to dry swallow not effective but liquid swallows decrease oropharyngeal residuals.  Pt was able to cough an expectorate  viscous secretions after MBS.  Pt's dysphagia is concerning for aspiration and ability to meet nutritional needs given effort/work of eating/swallowing.  Of note, pt with clear left facial asymmetry and leaning left during MBS - note he has a h/o right frontal hydrocephalus s/p VP shunt.  To mitigate aspiration risk, MD may desire to allow dys2/thin diet with strict precautions.  Will follow up for skilled dysphagia management/family/pt education.   SLP Visit Diagnosis Dysphagia, oropharyngeal phase (R13.12) Attention and concentration deficit following -- Frontal lobe and executive function deficit following -- Impact on safety and function Moderate aspiration risk;Severe aspiration risk   CHL IP TREATMENT RECOMMENDATION 10/26/2017 Treatment Recommendations Therapy as outlined in treatment plan below   Prognosis 10/26/2017 Prognosis for Safe Diet Advancement Guarded Barriers to Reach Goals Cognitive deficits;Severity of deficits Barriers/Prognosis Comment -- CHL IP DIET RECOMMENDATION 10/26/2017 SLP Diet Recommendations Dysphagia 2 (Fine chop) solids;Thin liquid Liquid Administration via Straw Medication Administration Whole meds with puree Compensations Slow rate;Small sips/bites;Follow solids with liquid;Other (Comment) Postural Changes --   CHL IP OTHER RECOMMENDATIONS 10/26/2017 Recommended Consults -- Oral Care Recommendations Oral care before and after PO Other Recommendations Have oral suction available   CHL IP FOLLOW UP RECOMMENDATIONS 10/25/2017 Follow up Recommendations Skilled Nursing facility;24 hour supervision/assistance   CHL IP FREQUENCY AND DURATION 10/26/2017 Speech Therapy Frequency (ACUTE ONLY) min 2x/week Treatment Duration 2 weeks      CHL IP ORAL PHASE 10/26/2017 Oral Phase Impaired Oral - Pudding Teaspoon -- Oral - Pudding Cup -- Oral - Honey Teaspoon -- Oral - Honey Cup -- Oral - Nectar Teaspoon Weak lingual manipulation;Lingual pumping;Premature spillage;Delayed oral transit;Decreased bolus  cohesion;Reduced posterior propulsion;Lingual/palatal residue Oral - Nectar Cup -- Oral - Nectar Straw Lingual pumping;Premature spillage;Weak  lingual manipulation;Delayed oral transit;Decreased bolus cohesion;Lingual/palatal residue;Reduced posterior propulsion Oral - Thin Teaspoon Weak lingual manipulation;Delayed oral transit;Lingual pumping;Premature spillage;Decreased bolus cohesion;Reduced posterior propulsion;Lingual/palatal residue Oral - Thin Cup Premature spillage;Delayed oral transit;Decreased bolus cohesion;Lingual pumping;Weak lingual manipulation;Reduced posterior propulsion;Lingual/palatal residue Oral - Thin Straw Decreased bolus cohesion;Delayed oral transit;Reduced posterior propulsion;Lingual/palatal residue Oral - Puree Decreased bolus cohesion;Premature spillage;Weak lingual manipulation;Lingual pumping;Delayed oral transit;Reduced posterior propulsion;Lingual/palatal residue Oral - Mech Soft -- Oral - Regular Decreased bolus cohesion;Delayed oral transit;Lingual pumping;Weak lingual manipulation;Impaired mastication;Piecemeal swallowing;Premature spillage;Reduced posterior propulsion;Lingual/palatal residue Oral - Multi-Consistency -- Oral - Pill -- Oral Phase - Comment --  CHL IP PHARYNGEAL PHASE 10/26/2017 Pharyngeal Phase Impaired Pharyngeal- Pudding Teaspoon -- Pharyngeal -- Pharyngeal- Pudding Cup -- Pharyngeal -- Pharyngeal- Honey Teaspoon -- Pharyngeal -- Pharyngeal- Honey Cup -- Pharyngeal -- Pharyngeal- Nectar Teaspoon Reduced epiglottic inversion;Reduced tongue base retraction;Pharyngeal residue - valleculae Pharyngeal -- Pharyngeal- Nectar Cup -- Pharyngeal -- Pharyngeal- Nectar Straw Reduced epiglottic inversion;Reduced tongue base retraction;Pharyngeal residue - valleculae Pharyngeal -- Pharyngeal- Thin Teaspoon Reduced epiglottic inversion;Reduced tongue base retraction;Pharyngeal residue - valleculae Pharyngeal -- Pharyngeal- Thin Cup Reduced epiglottic inversion;Pharyngeal  residue - valleculae;Penetration/Apiration after swallow;Reduced airway/laryngeal closure;Reduced laryngeal elevation Pharyngeal Material enters airway, passes BELOW cords without attempt by patient to eject out (silent aspiration) Pharyngeal- Thin Straw Penetration/Apiration after swallow;Reduced laryngeal elevation;Reduced epiglottic inversion;Reduced airway/laryngeal closure;Reduced tongue base retraction;Pharyngeal residue - valleculae Pharyngeal -- Pharyngeal- Puree Delayed swallow initiation-vallecula;Pharyngeal residue - valleculae;Reduced epiglottic inversion;Reduced tongue base retraction Pharyngeal -- Pharyngeal- Mechanical Soft -- Pharyngeal -- Pharyngeal- Regular Delayed swallow initiation-vallecula;Reduced tongue base retraction;Pharyngeal residue - valleculae;Reduced epiglottic inversion Pharyngeal -- Pharyngeal- Multi-consistency -- Pharyngeal -- Pharyngeal- Pill -- Pharyngeal -- Pharyngeal Comment --  CHL IP CERVICAL ESOPHAGEAL PHASE 10/26/2017 Cervical Esophageal Phase WFL Pudding Teaspoon -- Pudding Cup -- Honey Teaspoon -- Honey Cup -- Nectar Teaspoon -- Nectar Cup -- Nectar Straw -- Thin Teaspoon -- Thin Cup -- Thin Straw -- Puree -- Mechanical Soft -- Regular -- Multi-consistency -- Pill -- Cervical Esophageal Comment -- No flowsheet data found. Macario Golds 10/26/2017, 10:02 AM Luanna Salk, MS [facility] SLP [phone]              Dg Hip Unilat With Pelvis 2-3 Views Left  Result Date: 10/10/2017 CLINICAL DATA:  Unwitnessed fall.  Hip pain. EXAM: DG HIP (WITH OR WITHOUT PELVIS) 2-3V LEFT COMPARISON:  None. FINDINGS: Acute, comminuted varus angulated intertrochanteric fracture of the left femur with avulsed lesser trochanter. No femoral head dislocation. Surgical clips project over the left hemipelvis. No pelvic fracture. Lower lumbar degenerative disc and facet arthropathy from L4 caudad. IMPRESSION: Acute, comminuted varus angulated intertrochanteric fracture of the left femur with avulsed  lesser trochanter. Electronically Signed   By: Ashley Royalty M.D.   On: 10/18/2017 23:32   Dg Femur 1v Left  Result Date: 10/14/2017 CLINICAL DATA:  Fracture of the left hip EXAM: LEFT FEMUR 1 VIEW COMPARISON:  Left hip radiographs acquired sequentially. FINDINGS: AP view of the mid to distal left femur was provided in conjunction with previous same day hip radiographs. The patient has a known left intertrochanteric fracture of the left femur with avulsed lesser trochanter. The visualized femoral shaft and condyles appear intact on this single frontal view of the mid to distal femur. IMPRESSION: No fracture of the mid to distal femur nor femoral condyles. Please see the left hip radiograph report for fracture of the left intertrochanter with avulsed lesser trochanter. Electronically Signed   By: Ashley Royalty M.D.   On: 10/30/2017 23:34  Microbiology: Recent Results (from the past 240 hour(s))  MRSA PCR Screening     Status: None   Collection Time: 10/22/17  3:20 PM  Result Value Ref Range Status   MRSA by PCR NEGATIVE NEGATIVE Final    Comment:        The GeneXpert MRSA Assay (FDA approved for NASAL specimens only), is one component of a comprehensive MRSA colonization surveillance program. It is not intended to diagnose MRSA infection nor to guide or monitor treatment for MRSA infections. Performed at [facility], [place] [address] [place], [place] [address]      Labs: Basic Metabolic Panel: Recent Labs  Lab 10/24/17 0328 10/25/17 0308 10/26/17 0343 10/28/17 0339 2017/11/27 0342  NA 144 143 146* 149* 152*  K 3.2* 3.3* 3.5 3.2* 4.5  CL 106 108 111 110 112*  CO2 26 25 25 28 26   GLUCOSE 147* 140* 131* 113* 105*  BUN 37* 39* 39* 30* 37*  CREATININE 1.14 1.05 1.00 1.10 1.67*  CALCIUM 8.0* 7.8* 7.9* 7.8* 8.0*  MG  --   --   --  2.5*  --    Liver Function Tests: Recent Labs  Lab 27-Nov-2017 0900  AST 17  ALT 11*  ALKPHOS 50  BILITOT 0.6  PROT 5.8*  ALBUMIN  2.5*    Recent Labs  Lab 11/27/2017 0900  AMMONIA 13   CBC: Recent Labs  Lab 10/25/17 0308 10/26/17 0343 10/27/17 0159 10/28/17 0339 2017/11/27 0342  WBC 13.8* 13.4* 12.1* 12.2* 15.2*  HGB 8.6* 8.7* 8.6* 8.1* 8.4*  HCT 26.1* 27.4* 27.6* 26.2* 28.3*  MCV 91.3 94.8 94.2 97.4 101.8*  PLT 281 303 279 301 328     D'Arcy Abraha  [facility] 10/30/2017, 3:02 PM

## 2017-11-09 NOTE — Progress Notes (Signed)
Pts close friends came to visit, they were under the impression he was comfort care and were upset at how pt was still on BiPAP, Looked uncomfortable. Morphine given with no improvement. MD paged for additional orders. MD called pts POA and decision was made to remove BiPAP cancel head CT and make comfortable.

## 2017-11-09 NOTE — Progress Notes (Addendum)
[facility] PROGRESS NOTE  [name]. XKG:[id number] DOB: 1924-09-16 DOA: 10/28/2017  PCP: Lajean Manes, MD  Brief History/Interval Summary: 84 year old Caucasian male with advanced dementia, diabetes mellitus type 2, history of bladder cancer, coronary artery disease who lived in assisted living facility and presented after a mechanical fall.  He was found to have left hip fracture.  He underwent repair on 3/12.  Stay was complicated by multifocal pneumonia resulting in acute hypoxic respiratory failure.  Over the last 48 hours patient's mental status has worsened.  Overnight he had to be placed on BiPAP for worsening oxygenation.  Reason for Visit: Acute hypoxic respiratory failure  Consultants: None  Procedures: None  Antibiotics: Zosyn  Subjective/Interval History: Patient remains poorly responsive this morning.  He is on BiPAP.  Does not even open his eyes today.  Noted to be moving his extremities.  ROS: Unable to do  Objective:  Vital Signs  Vitals:   11/25/2017 0235 25-Nov-2017 0335 11-25-17 0346 25-Nov-2017 0600  BP: (!) 101/45  (!) 101/45 (!) 111/54  Pulse: 87  84   Resp: 16  15 (!) 26  Temp:  98.9 F (37.2 C)    TempSrc:  Axillary    SpO2: 98%  100% 95%  Weight:  72.2 kg (159 lb 2.8 oz)    Height:        Intake/Output Summary (Last 24 hours) at 2017-11-25 0815 Last data filed at 11-25-17 0458 Gross per 24 hour  Intake 50 ml  Output -  Net 50 ml   Filed Weights   10/26/17 0400 10/27/17 0500 Nov 25, 2017 0335  Weight: 73.3 kg (161 lb 9.6 oz) 73.3 kg (161 lb 9.6 oz) 72.2 kg (159 lb 2.8 oz)    General appearance: Poorly responsive this morning Resp: Noted to be on BiPAP.  Tachypneic.  Diminished air entry at the bases.  No definite wheezing rales or rhonchi.   Cardio: S1-S2 is normal regular.  No S3-S4.  No rubs murmurs of bruit. GI: Abdomen is soft.  Nontender nondistended.  Bowel sounds are present.  No masses organomegaly Extremities: No  edema Neurologic: Somnolent.  Barely arousable.  Moving all his extremities.  Lab Results:  Data Reviewed: I have personally reviewed following labs and imaging studies  CBC: Recent Labs  Lab 10/25/17 0308 10/26/17 0343 10/27/17 0159 10/28/17 0339 11/25/2017 0342  WBC 13.8* 13.4* 12.1* 12.2* 15.2*  HGB 8.6* 8.7* 8.6* 8.1* 8.4*  HCT 26.1* 27.4* 27.6* 26.2* 28.3*  MCV 91.3 94.8 94.2 97.4 101.8*  PLT 281 303 279 301 702    Basic Metabolic Panel: Recent Labs  Lab 10/24/17 0328 10/25/17 0308 10/26/17 0343 10/28/17 0339 11/25/17 0342  NA 144 143 146* 149* 152*  K 3.2* 3.3* 3.5 3.2* 4.5  CL 106 108 111 110 112*  CO2 26 25 25 28 26   GLUCOSE 147* 140* 131* 113* 105*  BUN 37* 39* 39* 30* 37*  CREATININE 1.14 1.05 1.00 1.10 1.67*  CALCIUM 8.0* 7.8* 7.9* 7.8* 8.0*  MG  --   --   --  2.5*  --     GFR: Estimated Creatinine Clearance: 30 mL/min (A) (by C-G formula based on SCr of 1.67 mg/dL (H)).    Recent Results (from the past 240 hour(s))  Surgical PCR screen     Status: None   Collection Time: 11/04/2017  6:14 AM  Result Value Ref Range Status   MRSA, PCR NEGATIVE NEGATIVE Final   Staphylococcus aureus NEGATIVE NEGATIVE Final  Comment: (NOTE) The Xpert SA Assay (FDA approved for NASAL specimens in patients 11 years of age and older), is one component of a comprehensive surveillance program. It is not intended to diagnose infection nor to guide or monitor treatment. Performed at [facility], [place] [address], [place], [place] [address]   MRSA PCR Screening     Status: None   Collection Time: 10/22/17  3:20 PM  Result Value Ref Range Status   MRSA by PCR NEGATIVE NEGATIVE Final    Comment:        The GeneXpert MRSA Assay (FDA approved for NASAL specimens only), is one component of a comprehensive MRSA colonization surveillance program. It is not intended to diagnose MRSA infection nor to guide or monitor treatment for MRSA  infections. Performed at [facility], [place] [address] [place], [place] [address]       Radiology Studies: Dg Chest Port 1 View  Result Date: 10/28/2017 CLINICAL DATA:  Hypoxia today, history COPD, coronary artery disease, diabetes mellitus, hypertension, former smoker EXAM: PORTABLE CHEST 1 VIEW COMPARISON:  Portable exam 1328 hours compared to 10/27/2017 FINDINGS: VP shunt tubing traverses RIGHT hemithorax. Borderline enlargement of cardiac silhouette. Atherosclerotic calcification aorta. Mediastinal contours and pulmonary vascularity normal. Bibasilar atelectasis versus consolidation greater on LEFT, unchanged. Upper lungs clear. No significant pleural effusion or pneumothorax. Bones demineralized. IMPRESSION: Bibasilar atelectasis versus consolidation LEFT greater than RIGHT unchanged. Electronically Signed   By: Lavonia Dana M.D.   On: 10/28/2017 13:55   Dg Chest Port 1 View  Result Date: 10/27/2017 CLINICAL DATA:  Hypoxia. EXAM: PORTABLE CHEST 1 VIEW COMPARISON:  10/23/2017. FINDINGS: Cardiomegaly with normal pulmonary vascularity. Low lung volumes with bibasilar atelectasis/infiltrates. Small bilateral pleural effusions. These findings were improved slightly from prior exam. IMPRESSION: Cardiomegaly with normal pulmonary vascularity. 2. Low lung volumes with bibasilar atelectasis/infiltrates and small bilateral pleural effusions. Findings have improved slightly from prior exam. Electronically Signed   By: [place]   On: 10/27/2017 13:41     Medications:  Scheduled: . [START ON 10/30/2017] busPIRone  5 mg Oral BID  . cholecalciferol  2,000 Units Oral Daily  . cyanocobalamin  1,000 mcg Intramuscular Daily  . diltiazem  180 mg Oral Daily  . docusate sodium  100 mg Oral BID  . ferrous sulfate  325 mg Oral Q breakfast  . mouth rinse  15 mL Mouth Rinse BID  . multivitamin  1 tablet Oral BID  . [START ON 10/30/2017] sertraline  50 mg Oral Daily  . thiamine  injection  100 mg Intravenous Daily   Continuous: . piperacillin-tazobactam (ZOSYN)  IV 3.375 g (2017-11-10 0458)   HBZ:[id number] **OR** acetaminophen, HYDROcodone-acetaminophen, ipratropium-albuterol, menthol-cetylpyridinium **OR** phenol, morphine injection, ondansetron **OR** ondansetron (ZOFRAN) IV, polyethylene glycol  Assessment/Plan:  Principal Problem:   Closed left hip fracture (HCC) Active Problems:   Anemia associated with acute blood loss   Depression    Acute hypoxic and hypercapnic respiratory failure/multifocal pneumonia Patient's respiratory status worsened after surgery.  Quite possible he aspirated.  Imaging study showed multifocal pneumonia.  He was placed on Zosyn.  X-rays were repeated due to poor oxygenation and did not show any new findings.  He was seen by speech therapy.  Noted to have aspiration risk.  He was placed on a dysphagia diet.  Over the last 48 hours patient has required higher levels of oxygen and his mental status had deteriorated as well.  ABG done this morning shows severe hypercapnic respiratory failure.  CT  head has been ordered to make sure there is no intracranial reason for his worsening respiratory status.  Ammonia level normal.  TSH was normal.  B12 level was 269 when recently checked.  We will initiate vitamin B12 intramuscularly.  Prognosis is guarded to poor at this time.  Discussed with patient's healthcare power of attorney.    Closed left hip fracture status post ORIF on 3/12 From a surgical standpoint there are no active issues currently.  DVT prophylaxis with Lovenox was recommended however currently being held due to dropping hemoglobin and heme positive stools.    Acute blood loss anemia Thought to be secondary to surgical loss.  He did require 1 unit of transfusion on 3/15.  Hemoglobin has been slowly trending down.  Was noted to have dark stools but he is on iron tablets.  Stool was heme positive.  Last colonoscopy in 2007 when  diverticulosis and polyps were noted.  Continue to hold Lovenox.  Hemoglobin stable this morning.  Transfuse as needed.  No clear indication to pursue endoscopy at this time especially considering patient's tenuous respiratory status.  We will not pursue this as long as he does not have overt bleeding.  Acute metabolic encephalopathy/dementia Patient has underlying dementia.  Now with worsening respiratory acidosis.  CT head has been ordered to rule out intracranial process.  Holding his psychotropic medications.  Hold sedative agents.    Acute renal failure with hypernatremia Start on gentle IV hydration.  Monitor urine output.  Hypokalemia Repleted.  Normal today.  Essential hypertension Continue home medications.  Monitor blood pressures closely.  History of anxiety and depression Holding his medications due to worsening encephalopathy.  History of chronic systolic CHF EF noted to be 40-45%.  He appears to be euvolemic.  Continue current medications.  Dysphagia As discussed above.  Chronic constipation MiraLAX.  History of ventriculomegaly He has a shunt placed.  CT head on 3/11 without any acute process.  One will be repeated today for possible.  ADDENDUM Patient reevaluated this afternoon.  He appears to be in discomfort.  He is noted to have apneic spells.  Called by the nurse later on saying that the patient appeared to be in a lot of discomfort and pain after he was given a bath.  Called patient's health care POA over the phone.  He is of the view that at this time main goal should be comfort as the patient is in pain.  Patient appears not to be tolerating his BiPAP well.  We will discontinue BiPAP.  Cancel the CT head.  Discontinue medications which do not provide comfort.  Increase the dose of morphine for pain relief.  Ativan for agitation.  Patient declining.  Depending on how he does over the next 24-48 hours may need to think about residential hospice.  DVT Prophylaxis:  Hold Lovenox.  SCDs.    Code Status: DNR Family Communication: No family at bedside.  Discussed with patient's power of attorney, Ronalee Belts. Disposition Plan: Management as outlined above.  Prognosis is poor.    LOS: 9 days   [facility] Pager [phone] 11/09/17, 8:15 AM  If 7PM-7AM, please contact night-coverage at [email or web address], password [facility]

## 2017-11-09 NOTE — Progress Notes (Signed)
SLP Cancellation Note  Patient Details Name: [name]. MRN: [id number] DOB: 06/22/1925   Cancelled treatment:       Reason Eval/Treat Not Completed: Other (comment)(pt now on Bipap and possibly aspirated per MD note)   Luanna Salk, [facility] [facility] SLP [phone]

## 2017-11-09 DEATH — deceased

## 2018-02-03 ENCOUNTER — Ambulatory Visit (INDEPENDENT_AMBULATORY_CARE_PROVIDER_SITE_OTHER): Payer: Medicare Other | Admitting: Ophthalmology

## 2019-11-04 IMAGING — CR DG TOE 4TH 2+V*L*
4 series · 4 of 4 positions shown · non-contrast
Comparison: 12/24/2009

CLINICAL DATA: Unwitnessed tall, fourth toe deformity

EXAM:
LEFT FOURTH TOE

[x toes ap left]
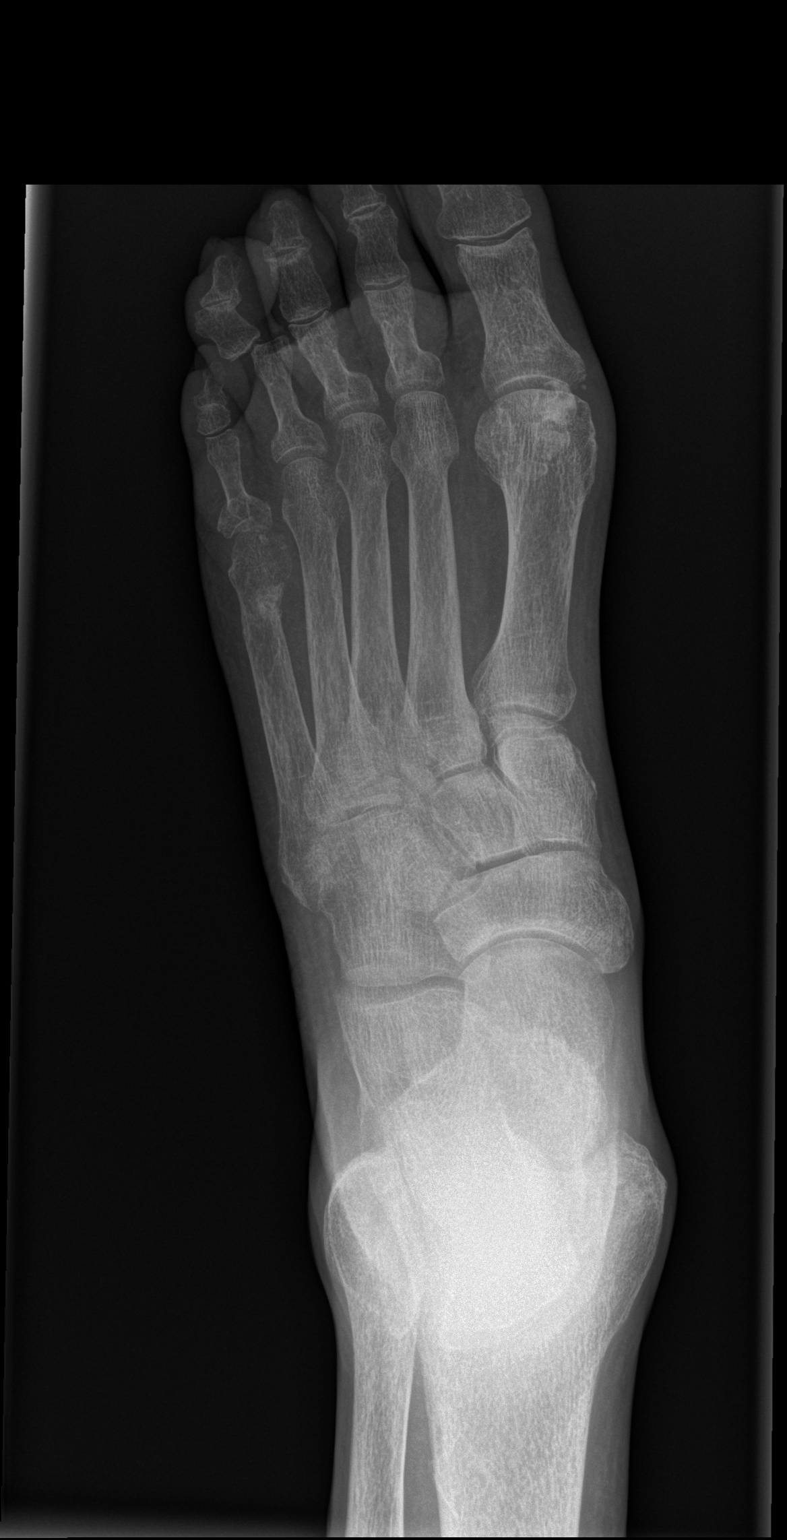

[x toes obl left]
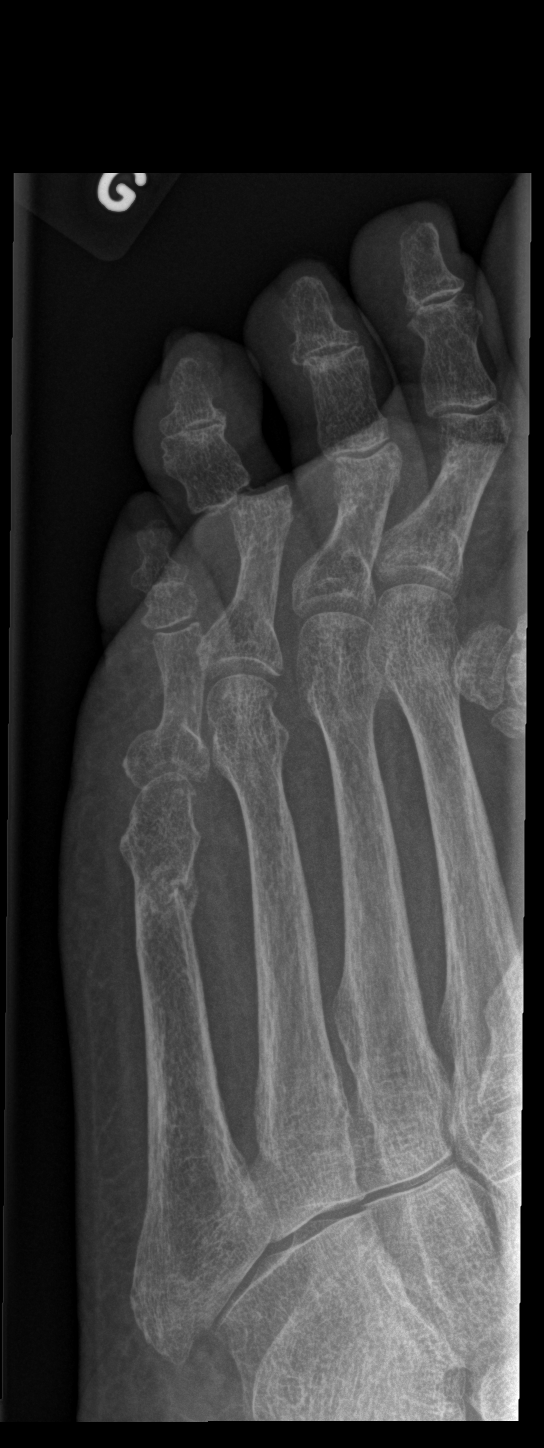

[x toes lat left (1 of 2)]
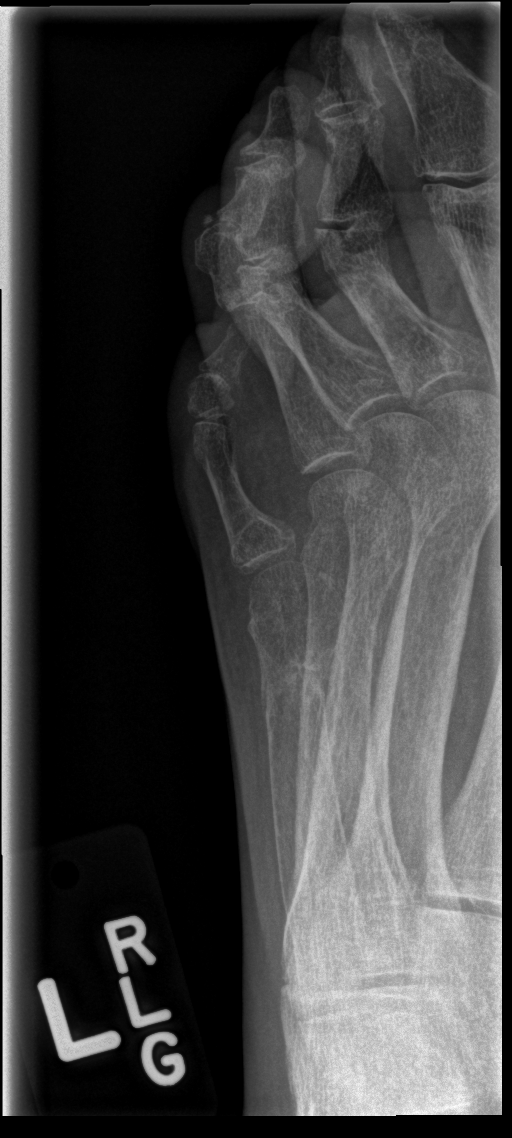

[x toes lat left (2 of 2)]
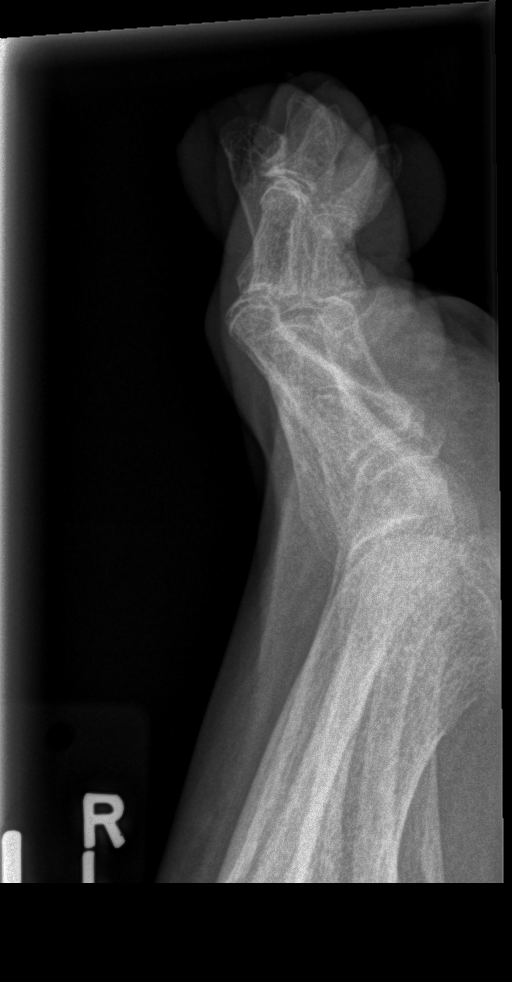

[4 of 4 positions shown; findings below may reference images not displayed]

FINDINGS: Diffuse osseous demineralization.

Dislocation of the fourth toe PIP joint dorsally and laterally.

Tiny fracture fragment noted at joint space.

Healing fracture of the distal fifth metatarsal identified with
callus.

No additional fracture, dislocation, or bone destruction.
IMPRESSION: Dorsolateral dislocation fourth PIP joint with a tiny fracture
fragment at the joint space.

Healing distal fifth metatarsal fracture.

Osseous demineralization.

## 2020-05-11 DEATH — deceased
